# Patient Record
Sex: Male | Born: 1975 | Race: White | Hispanic: No | Marital: Married | Smoking: Former smoker
Health system: Southern US, Community
[De-identification: ages and names within clinical notes are randomized; demographics above are authoritative.]

## PROBLEM LIST (undated history)

## (undated) DIAGNOSIS — T7840XA Allergy, unspecified, initial encounter: Secondary | ICD-10-CM

## (undated) DIAGNOSIS — F410 Panic disorder [episodic paroxysmal anxiety] without agoraphobia: Secondary | ICD-10-CM

## (undated) DIAGNOSIS — B019 Varicella without complication: Secondary | ICD-10-CM

## (undated) DIAGNOSIS — G43909 Migraine, unspecified, not intractable, without status migrainosus: Secondary | ICD-10-CM

## (undated) DIAGNOSIS — F1120 Opioid dependence, uncomplicated: Secondary | ICD-10-CM

## (undated) HISTORY — DX: Varicella without complication: B01.9

## (undated) HISTORY — DX: Allergy, unspecified, initial encounter: T78.40XA

## (undated) HISTORY — DX: Migraine, unspecified, not intractable, without status migrainosus: G43.909

## (undated) HISTORY — PX: NASAL SEPTUM SURGERY: SHX37

---

## 2005-12-15 ENCOUNTER — Emergency Department: Payer: Self-pay | Admitting: Emergency Medicine

## 2007-10-24 ENCOUNTER — Emergency Department (HOSPITAL_COMMUNITY): Admission: EM | Admit: 2007-10-24 | Discharge: 2007-10-24 | Payer: Self-pay | Admitting: Emergency Medicine

## 2008-03-27 ENCOUNTER — Ambulatory Visit: Payer: Self-pay | Admitting: Anesthesiology

## 2008-05-10 ENCOUNTER — Emergency Department (HOSPITAL_COMMUNITY): Admission: EM | Admit: 2008-05-10 | Discharge: 2008-05-10 | Payer: Self-pay | Admitting: Emergency Medicine

## 2008-05-18 ENCOUNTER — Emergency Department: Payer: Self-pay | Admitting: Emergency Medicine

## 2008-06-15 ENCOUNTER — Emergency Department (HOSPITAL_COMMUNITY): Admission: EM | Admit: 2008-06-15 | Discharge: 2008-06-15 | Payer: Self-pay | Admitting: Emergency Medicine

## 2008-06-18 ENCOUNTER — Emergency Department: Payer: Self-pay | Admitting: Emergency Medicine

## 2008-12-11 ENCOUNTER — Emergency Department (HOSPITAL_COMMUNITY): Admission: EM | Admit: 2008-12-11 | Discharge: 2008-12-11 | Payer: Self-pay | Admitting: Emergency Medicine

## 2008-12-30 ENCOUNTER — Emergency Department (HOSPITAL_COMMUNITY): Admission: EM | Admit: 2008-12-30 | Discharge: 2008-12-30 | Payer: Self-pay | Admitting: Emergency Medicine

## 2009-02-23 ENCOUNTER — Emergency Department: Payer: Self-pay | Admitting: Internal Medicine

## 2009-03-02 ENCOUNTER — Emergency Department (HOSPITAL_COMMUNITY): Admission: EM | Admit: 2009-03-02 | Discharge: 2009-03-02 | Payer: Self-pay | Admitting: Emergency Medicine

## 2009-03-16 ENCOUNTER — Emergency Department (HOSPITAL_COMMUNITY): Admission: EM | Admit: 2009-03-16 | Discharge: 2009-03-16 | Payer: Self-pay | Admitting: Emergency Medicine

## 2009-03-17 ENCOUNTER — Emergency Department: Payer: Self-pay | Admitting: Emergency Medicine

## 2009-05-28 ENCOUNTER — Emergency Department: Payer: Self-pay | Admitting: Emergency Medicine

## 2009-06-07 ENCOUNTER — Emergency Department (HOSPITAL_COMMUNITY): Admission: EM | Admit: 2009-06-07 | Discharge: 2009-06-07 | Payer: Self-pay | Admitting: Emergency Medicine

## 2009-06-11 ENCOUNTER — Emergency Department (HOSPITAL_COMMUNITY): Admission: EM | Admit: 2009-06-11 | Discharge: 2009-06-11 | Payer: Self-pay | Admitting: Emergency Medicine

## 2009-06-12 ENCOUNTER — Emergency Department (HOSPITAL_COMMUNITY): Admission: EM | Admit: 2009-06-12 | Discharge: 2009-06-12 | Payer: Self-pay | Admitting: Adult Health

## 2009-06-21 ENCOUNTER — Emergency Department (HOSPITAL_COMMUNITY): Admission: EM | Admit: 2009-06-21 | Discharge: 2009-06-21 | Payer: Self-pay | Admitting: Emergency Medicine

## 2010-07-27 HISTORY — PX: VASECTOMY: SHX75

## 2012-03-11 ENCOUNTER — Ambulatory Visit: Payer: Self-pay

## 2012-03-14 ENCOUNTER — Emergency Department: Payer: Self-pay | Admitting: Emergency Medicine

## 2012-03-14 LAB — COMPREHENSIVE METABOLIC PANEL
Albumin: 3.9 g/dL (ref 3.4–5.0)
Anion Gap: 3 — ABNORMAL LOW (ref 7–16)
Calcium, Total: 8.7 mg/dL (ref 8.5–10.1)
Chloride: 100 mmol/L (ref 98–107)
Co2: 33 mmol/L — ABNORMAL HIGH (ref 21–32)
EGFR (African American): >60
EGFR (Non-African Amer.): >60
Potassium: 4 mmol/L (ref 3.5–5.1)
SGOT(AST): 29 U/L (ref 15–37)
SGPT (ALT): 32 U/L (ref 12–78)

## 2012-03-14 LAB — URINALYSIS, COMPLETE
Nitrite: NEGATIVE
Ph: 6 (ref 4.5–8.0)
Protein: 30
RBC,UR: 6 /HPF (ref 0–5)
Specific Gravity: 1.021 (ref 1.003–1.030)
WBC UR: 1 /HPF (ref 0–5)

## 2012-03-14 LAB — DRUG SCREEN, URINE
Barbiturates, Ur Screen: NEGATIVE (ref ?–200)
Benzodiazepine, Ur Scrn: NEGATIVE (ref ?–200)
Cannabinoid 50 Ng, Ur ~~LOC~~: NEGATIVE (ref ?–50)
Methadone, Ur Screen: POSITIVE (ref ?–300)
Phencyclidine (PCP) Ur S: POSITIVE (ref ?–25)
Tricyclic, Ur Screen: NEGATIVE (ref ?–1000)

## 2012-03-14 LAB — CBC
HCT: 37.2 % — ABNORMAL LOW (ref 40.0–52.0)
MCHC: 34.5 g/dL (ref 32.0–36.0)
MCV: 84 fL (ref 80–100)
Platelet: 328 10*3/uL (ref 150–440)
RBC: 4.43 10*6/uL (ref 4.40–5.90)
RDW: 12.5 % (ref 11.5–14.5)
WBC: 12.8 10*3/uL — ABNORMAL HIGH (ref 3.8–10.6)

## 2012-03-14 LAB — ETHANOL: Ethanol: <3 mg/dL

## 2013-12-30 ENCOUNTER — Encounter (HOSPITAL_COMMUNITY): Payer: Self-pay | Admitting: Emergency Medicine

## 2013-12-30 ENCOUNTER — Emergency Department (HOSPITAL_COMMUNITY)
Admission: EM | Admit: 2013-12-30 | Discharge: 2013-12-30 | Discharge status: Against Medical Advice | Payer: 59 | Attending: Neurology | Admitting: Neurology

## 2013-12-30 DIAGNOSIS — R109 Unspecified abdominal pain: Secondary | ICD-10-CM | POA: Diagnosis present

## 2013-12-30 LAB — COMPREHENSIVE METABOLIC PANEL
ALT: 47 U/L (ref 0–53)
AST: 29 U/L (ref 0–37)
Albumin: 4.1 g/dL (ref 3.5–5.2)
Alkaline Phosphatase: 93 U/L (ref 39–117)
BUN: 12 mg/dL (ref 6–23)
CALCIUM: 10 mg/dL (ref 8.4–10.5)
CO2: 26 mEq/L (ref 19–32)
CREATININE: 0.94 mg/dL (ref 0.50–1.35)
Chloride: 99 mEq/L (ref 96–112)
GFR calc Af Amer: >90 mL/min (ref >90)
GFR calc non Af Amer: >90 mL/min (ref >90)
Glucose, Bld: 120 mg/dL — ABNORMAL HIGH (ref 70–99)
Potassium: 4 mEq/L (ref 3.7–5.3)
Sodium: 139 mEq/L (ref 137–147)
TOTAL PROTEIN: 7.6 g/dL (ref 6.0–8.3)
Total Bilirubin: 0.2 mg/dL — ABNORMAL LOW (ref 0.3–1.2)

## 2013-12-30 LAB — CBC WITH DIFFERENTIAL/PLATELET
Basophils Absolute: 0 10*3/uL (ref 0.0–0.1)
Basophils Relative: 0 % (ref 0–1)
EOS ABS: 0.3 10*3/uL (ref 0.0–0.7)
Eosinophils Relative: 4 % (ref 0–5)
HCT: 39.8 % (ref 39.0–52.0)
Hemoglobin: 13.8 g/dL (ref 13.0–17.0)
LYMPHS ABS: 3.2 10*3/uL (ref 0.7–4.0)
Lymphocytes Relative: 33 % (ref 12–46)
MCH: 28.9 pg (ref 26.0–34.0)
MCHC: 34.7 g/dL (ref 30.0–36.0)
MCV: 83.3 fL (ref 78.0–100.0)
MONO ABS: 0.5 10*3/uL (ref 0.1–1.0)
MONOS PCT: 5 % (ref 3–12)
Neutro Abs: 5.6 10*3/uL (ref 1.7–7.7)
Neutrophils Relative %: 58 % (ref 43–77)
Platelets: 310 10*3/uL (ref 150–400)
RBC: 4.78 MIL/uL (ref 4.22–5.81)
RDW: 12.4 % (ref 11.5–15.5)
WBC: 9.7 10*3/uL (ref 4.0–10.5)

## 2013-12-30 LAB — LIPASE, BLOOD: LIPASE: 21 U/L (ref 11–59)

## 2013-12-30 NOTE — ED Notes (Signed)
Called pt for re-assessment with no answer 

## 2013-12-30 NOTE — ED Notes (Signed)
Per pt sts burning pain in stomach since yesterday. sts he has been drinking carbonated beverages with some relief. sts only about 30 min and then it returns. sts possible he has ben spitting up blood.

## 2014-09-05 ENCOUNTER — Emergency Department (HOSPITAL_COMMUNITY)
Admission: EM | Admit: 2014-09-05 | Discharge: 2014-09-05 | Disposition: A | Discharge status: Home / Self Care | Payer: Self-pay | Attending: Emergency Medicine | Admitting: Emergency Medicine

## 2014-09-05 ENCOUNTER — Encounter (HOSPITAL_COMMUNITY): Payer: Self-pay | Admitting: Neurology

## 2014-09-05 DIAGNOSIS — T23192A Burn of first degree of multiple sites of left wrist and hand, initial encounter: Secondary | ICD-10-CM | POA: Insufficient documentation

## 2014-09-05 DIAGNOSIS — Y9389 Activity, other specified: Secondary | ICD-10-CM | POA: Insufficient documentation

## 2014-09-05 DIAGNOSIS — Y272XXA Contact with hot fluids, undetermined intent, initial encounter: Secondary | ICD-10-CM | POA: Insufficient documentation

## 2014-09-05 DIAGNOSIS — Y998 Other external cause status: Secondary | ICD-10-CM | POA: Insufficient documentation

## 2014-09-05 DIAGNOSIS — T23102A Burn of first degree of left hand, unspecified site, initial encounter: Secondary | ICD-10-CM

## 2014-09-05 DIAGNOSIS — Y92511 Restaurant or cafe as the place of occurrence of the external cause: Secondary | ICD-10-CM | POA: Insufficient documentation

## 2014-09-05 MED ORDER — SILVER SULFADIAZINE 1 % EX CREA
TOPICAL_CREAM | Freq: Once | CUTANEOUS | Status: AC
  Administered 2014-09-05: 11:00:00 via TOPICAL
  Filled 2014-09-05: qty 85

## 2014-09-05 MED ORDER — SILVER SULFADIAZINE 1 % EX CREA: 1.0000 "application " | TOPICAL_CREAM | Freq: Every day | CUTANEOUS | Status: DC

## 2014-09-05 MED ORDER — IBUPROFEN 400 MG PO TABS
800.0000 mg | ORAL_TABLET | Freq: Once | ORAL | Status: AC
  Administered 2014-09-05: 800 mg via ORAL
  Filled 2014-09-05: qty 4
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
## 2014-09-05 NOTE — Discharge Instructions (Signed)
Do not hesitate to return to the emergency room for any new, worsening or concerning symptoms.  Please obtain primary care using resource guide below. But the minute you were seen in the emergency room and that they will need to obtain records for further outpatient management.  If you see signs of infection (warmth, redness, tenderness, pus, sharp increase in pain, fever, red streaking) immediately return to the emergency department.  Burn Care Your skin is a natural barrier to infection. It is the largest organ of your body. Burns damage this natural protection. To help prevent infection, it is very important to follow your caregiver's instructions in the care of your burn. Burns are classified as:  First degree. There is only redness of the skin (erythema). No scarring is expected.  Second degree. There is blistering of the skin. Scarring may occur with deeper burns.  Third degree. All layers of the skin are injured, and scarring is expected. HOME CARE INSTRUCTIONS   Wash your hands well before changing your bandage.  Change your bandage as often as directed by your caregiver.  Remove the old bandage. If the bandage sticks, you may soak it off with cool, clean water.  Cleanse the burn thoroughly but gently with mild soap and water.  Pat the area dry with a clean, dry cloth.  Apply a thin layer of antibacterial cream to the burn.  Apply a clean bandage as instructed by your caregiver.  Keep the bandage as clean and dry as possible.  Elevate the affected area for the first 24 hours, then as instructed by your caregiver.  Only take over-the-counter or prescription medicines for pain, discomfort, or fever as directed by your caregiver. SEEK IMMEDIATE MEDICAL CARE IF:   You develop excessive pain.  You develop redness, tenderness, swelling, or red streaks near the burn.  The burned area develops yellowish-white fluid (pus) or a bad smell.  You have a fever. MAKE SURE YOU:     Understand these instructions.  Will watch your condition.  Will get help right away if you are not doing well or get worse. Document Released: 07/13/2005 Document Revised: 10/05/2011 Document Reviewed: 12/03/2010 [facility] Patient Information 2015 [place], [place]. This information is not intended to replace advice given to you by your health care provider. Make sure you discuss any questions you have with your health care provider.    Emergency Department Resource Guide 1) Find a Doctor and Pay Out of Pocket Although you won't have to find out who is covered by your insurance plan, it is a good idea to ask around and get recommendations. You will then need to call the office and see if the doctor you have chosen will accept you as a new patient and what types of options they offer for patients who are self-pay. Some doctors offer discounts or will set up payment plans for their patients who do not have insurance, but you will need to ask so you aren't surprised when you get to your appointment.  2) Contact Your Local Health Department Not all health departments have doctors that can see patients for sick visits, but many do, so it is worth a call to see if yours does. If you don't know where your local health department is, you can check in your phone book. The CDC also has a tool to help you locate your state's health department, and many state websites also have listings of all of their local health departments.  3) Find a Walk-in Clinic If  your illness is not likely to be very severe or complicated, you may want to try a walk in clinic. These are popping up all over the country in pharmacies, drugstores, and shopping centers. They're usually staffed by nurse practitioners or physician assistants that have been trained to treat common illnesses and complaints. They're usually fairly quick and inexpensive. However, if you have serious medical issues or chronic medical problems, these are  probably not your best option.  No Primary Care Doctor: - Call Health Connect at  [phone] - they can help you locate a primary care doctor that  accepts your insurance, provides certain services, etc. - Physician Referral Service- [phone]  Chronic Pain Problems: Organization         Address  Phone   Notes  [facility]  [phone] Patients need to be referred by their primary care doctor.   Medication Assistance: Organization         Address  Phone   Notes  [facility] [address] [place], [place] [address] [phone] --Must be a resident of [facility] -- Must have NO insurance coverage whatsoever (no Medicaid/ Medicare, etc.) -- The pt. MUST have a primary care doctor that directs their care regularly and follows them in the community   [facility]ist  [phone]   [employer]  [phone]    Agencies that provide inexpensive medical care: Organization         Address  Phone   Notes  [place]  [phone]   [facility]    [phone]   [facility] [place], [place] [address] [phone]   [place] [address] [place] [phone]   Planned Parenthood    [phone]   [facility]    [phone]   [place] and [place] [address], [place] Phone:  [phone], Fax:  [phone] Hours of Operation:  9 am - 6 pm, M-F.  Also accepts Medicaid/Medicare and self-pay.  [facility] for [place], [address], [place] Phone: [phone], Fax: [phone]. Hours of Operation:  8:30 am - 5:30 pm, M-F.  Also accepts Medicaid and self-pay.  [facility] [place] [address], [place] Phone: [phone]   [place], [place], [place] [phone], Ext. [phone] Mondays & Thursdays:  7-9 AM.  First 15 patients are seen on a first come, first serve basis.    [place] Providers:  Organization         Address  Phone   Notes  [facility] [address], [place] [phone] Also accepts self-pay patients.  [facility] [address], [place]  [phone]   [place], [address], [place] [phone]   [facility] [address] [place] [phone]   Lucianne Lei [address], [place]   [phone] Only accepts [place] Access [place] patients after they have their name applied to their card.   Self-Pay (no insurance) in [facility]:  Organization         Address  Phone   Notes  Sickle Cell Patients, [facility] Internal [facility] [place], [place] [phone]  [facility] [place] [phone]   [facility] [place]  [place], [address], [place] [phone]   [facility]/Dr. Osei-Bonsu  [address], [place] or [address], [place] [phone] Phone number for both [place] and [place] locations is the same.  [facility] [address], [place] [phone]   [facility] [address], [place] or [address] [phone]   [facility] [address] [place] [phone], phone; [phone], fax Sees patients 1st and 3rd Saturday of every month.  Must not qualify for public or private insurance (i.e. Medicaid, Medicare, [place] Health Choice, Veterans' Benefits)  Household income should be no more than 200% of the poverty level The clinic cannot treat you if you are pregnant or think you are pregnant  Sexually transmitted diseases are not treated at the clinic.     Dental Care: Organization         Address  Phone  Notes  [facility] Department of [facility] [place] [phone] Accepts children up to age 73 who are enrolled in [place] or [place]; pregnant women with a Medicaid card; and children who have applied for Medicaid or [place] Health Choice, but were declined, whose parents can pay a reduced fee at time of service.  [facility] Department of [facility]  [address], [place] [phone] Accepts children up to age 48 who are enrolled in [place] or [place]; pregnant women with a Medicaid card; and children who have applied for Medicaid or [place] Health Choice, but were declined, whose parents can pay a reduced fee at time of service.  [place] Adult [facility] Access PROGRAM  [place] [phone] Patients are seen by appointment only. Walk-ins are not accepted. [place] will see patients 13 years of age and older. Monday - Tuesday (8am-5pm) Most Wednesdays (8:30-5pm) $30 per visit, cash only  [facility] Adult Dental Access PROGRAM  [address], [facility] [phone] Patients are seen by appointment only. Walk-ins are not accepted. [place] will see patients 71 years of age and older. One Wednesday Evening (Monthly: Volunteer Based).  $30 per visit, cash only  [place]  [phone] for adults; Children under age 29, call Graduate Pediatric Dentistry at [phone]. Children aged 48-14, please call [phone] to request a pediatric application.  Dental services are provided in all areas of dental care including fillings, crowns and bridges, complete and partial dentures, implants, gum treatment, root canals, and extractions. Preventive care is also provided. Treatment is provided to both adults and children. Patients are selected via a lottery and there is often a waiting  list.   [facility] [address], [place]  [phone] [email or web address]   [facility] [address] [place], [place] [phone], Ext. [phone] Second and Fourth Thursday of each month, opens at 6:30 AM; Clinic ends at 9 AM.  Patients are seen on a first-come first-served basis, and a limited number are seen during each clinic.   [facility]  [address] Hillard Danker [place], [place] [phone]   Eligibility Requirements You must have lived in [place], [place], or [place] counties for at least the  last three months.   You cannot be eligible for state or federal sponsored [employer], including [employer], [place], or [employer].   You generally cannot be eligible for healthcare insurance through your employer.    How to apply: Eligibility screenings are held every Tuesday and Wednesday afternoon from 1:00 pm until 4:00 pm. You do not need an appointment for the interview!  [facility] [address], [place], [place]   [place]  [place] Department  [place]  [phone]    Behavioral Health Resources in the Community: Intensive Outpatient Programs Organization         Address  Phone  Notes  [place]. [address], [place], [place] [phone]   [facility] [address], [place], [place]   ADS: Alcohol & Drug Svcs [address], [place], [place]   [place] [address],  [place], [place] or [phone]   Substance Abuse Resources Organization         Address  Phone  Notes  Alcohol and Drug Services  [phone]   [place]  [phone]   [facility] [place]   Chinita Pester  [phone]   Residential & Outpatient Substance Abuse Program   [phone]   Psychological Services Organization         Address  Phone  Notes  [facility] [place]  [place]  [phone]   Luis M. [place] [address], [place] or [phone]    Mobile Crisis Teams Organization         Address  Phone  Notes  [facility], Mobile Crisis Care Unit  [phone]   [facility]otherapeutic [facility]  [address] [place], [place]   [place] [address] [place] [phone]    Self-Help/Support Groups Organization         Address  Phone             Notes  [place]. of [place] - variety of support groups  [place] Call for more information  Narcotics Anonymous (NA), [facility] [address], [employer] [place]  2 meetings at this location   Special educational needs teacher         Address  Phone  Notes  ASAP Residential Treatment [place],    [place]  [phone]   [facility]  [address] [place] [address]77414, [place], [place]   [place], [place] [phone] Admissions: 8am-3pm M-F  Incentives Substance [place] [address],    [place], [place] [phone]   [facility] [address] [place], [place], [place]   [facility] [address],  [place], [place]   [facility] Programs - Intensive Outpatient [address], [place], [place]   [facility] ([place].,  [place], [place] [phone] or [phone]   [facility] Treatment Services (RTS) [address], [place], [place] Accepts Medicaid  [facility] [place] [address],  [place] [phone] Substance Abuse/Addiction Treatment   [facility] Organization          Address  Phone  Notes  [facility]  [phone]  Domenic Schwab, PhD [address] Arlis Porta [place], [place]   [phone] or [phone]   [place], [place] [phone]   [address], [place], [place] [phone] Insurance/Medicaid/sponsorship through [facility] and Families [address], [address] [place]                                    [place], [place] [phone] [place] [address], [place] [phone]    Dr. Adele Schilder  [phone]   [facility] Dept. 1) [address] [place] 2) [place] 3)  [place] [address], [place] [phone]  [phone]   [place] [phone] or [phone] (After Hours)

## 2015-10-15 ENCOUNTER — Encounter: Payer: Self-pay | Admitting: Internal Medicine

## 2015-10-15 ENCOUNTER — Ambulatory Visit (INDEPENDENT_AMBULATORY_CARE_PROVIDER_SITE_OTHER): Payer: BLUE CROSS/BLUE SHIELD | Admitting: Internal Medicine

## 2015-10-15 VITALS — BP 118/78 | HR 83 | Temp 97.8°F | Ht 66.75 in | Wt 219.0 lb

## 2015-10-15 DIAGNOSIS — G4719 Other hypersomnia: Secondary | ICD-10-CM

## 2015-10-15 DIAGNOSIS — R0683 Snoring: Secondary | ICD-10-CM

## 2015-10-15 DIAGNOSIS — G4700 Insomnia, unspecified: Secondary | ICD-10-CM

## 2015-10-15 DIAGNOSIS — F411 Generalized anxiety disorder: Secondary | ICD-10-CM

## 2015-10-15 DIAGNOSIS — M549 Dorsalgia, unspecified: Secondary | ICD-10-CM

## 2015-10-15 DIAGNOSIS — G43809 Other migraine, not intractable, without status migrainosus: Secondary | ICD-10-CM | POA: Insufficient documentation

## 2015-10-15 DIAGNOSIS — Z91048 Other nonmedicinal substance allergy status: Secondary | ICD-10-CM

## 2015-10-15 DIAGNOSIS — G8929 Other chronic pain: Secondary | ICD-10-CM

## 2015-10-15 DIAGNOSIS — Z9109 Other allergy status, other than to drugs and biological substances: Secondary | ICD-10-CM | POA: Insufficient documentation

## 2015-10-15 DIAGNOSIS — Z8042 Family history of malignant neoplasm of prostate: Secondary | ICD-10-CM

## 2015-10-15 MED ORDER — CITALOPRAM HYDROBROMIDE 10 MG PO TABS: 10.0000 mg | ORAL_TABLET | Freq: Every day | ORAL | Status: DC

## 2015-10-15 MED ORDER — ALPRAZOLAM 0.25 MG PO TABS: 0.2500 mg | ORAL_TABLET | Freq: Two times a day (BID) | ORAL | Status: DC | PRN

## 2015-10-15 NOTE — Progress Notes (Signed)
Pre visit review using our clinic review tool, if applicable. No additional management support is needed unless otherwise documented below in the visit note. 
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
## 2015-10-15 NOTE — Patient Instructions (Signed)
Generalized Anxiety Disorder Generalized anxiety disorder (GAD) is a mental disorder. It interferes with life functions, including relationships, work, and school. GAD is different from normal anxiety, which everyone experiences at some point in their lives in response to specific life events and activities. Normal anxiety actually helps us prepare for and get through these life events and activities. Normal anxiety goes away after the event or activity is over.  GAD causes anxiety that is not necessarily related to specific events or activities. It also causes excess anxiety in proportion to specific events or activities. The anxiety associated with GAD is also difficult to control. GAD can vary from mild to severe. People with severe GAD can have intense waves of anxiety with physical symptoms (panic attacks).  SYMPTOMS The anxiety and worry associated with GAD are difficult to control. This anxiety and worry are related to many life events and activities and also occur more days than not for 6 months or longer. People with GAD also have three or more of the following symptoms (one or more in children):  Restlessness.   Fatigue.  Difficulty concentrating.   Irritability.  Muscle tension.  Difficulty sleeping or unsatisfying sleep. DIAGNOSIS GAD is diagnosed through an assessment by your health care provider. Your health care provider will ask you questions aboutyour mood,physical symptoms, and events in your life. Your health care provider may ask you about your medical history and use of alcohol or drugs, including prescription medicines. Your health care provider may also do a physical exam and blood tests. Certain medical conditions and the use of certain substances can cause symptoms similar to those associated with GAD. Your health care provider may refer you to a mental health specialist for further evaluation. TREATMENT The following therapies are usually used to treat GAD:    Medication. Antidepressant medication usually is prescribed for long-term daily control. Antianxiety medicines may be added in severe cases, especially when panic attacks occur.   Talk therapy (psychotherapy). Certain types of talk therapy can be helpful in treating GAD by providing support, education, and guidance. A form of talk therapy called cognitive behavioral therapy can teach you healthy ways to think about and react to daily life events and activities.  Stress managementtechniques. These include yoga, meditation, and exercise and can be very helpful when they are practiced regularly. A mental health specialist can help determine which treatment is best for you. Some people see improvement with one therapy. However, other people require a combination of therapies.   This information is not intended to replace advice given to you by your health care provider. Make sure you discuss any questions you have with your health care provider.   Document Released: 11/07/2012 Document Revised: 08/03/2014 Document Reviewed: 11/07/2012 Elsevier Interactive Patient Education 2016 [employer]  

## 2015-10-15 NOTE — Assessment & Plan Note (Signed)
Continue Flonase daily 

## 2015-10-15 NOTE — Assessment & Plan Note (Signed)
Will start Celexa 10 mg daily RX for Xanax 0.25 mg for times of sever anxiety/panic Support offered today

## 2015-10-15 NOTE — Assessment & Plan Note (Signed)
He wants to continue Excedrin prn for right now He declines RX for Imitrex

## 2015-10-15 NOTE — Assessment & Plan Note (Signed)
He will continue Naproxen as needed

## 2015-10-15 NOTE — Assessment & Plan Note (Signed)
Will start PSA screening at 42

## 2015-10-24 ENCOUNTER — Encounter: Payer: Self-pay | Admitting: Internal Medicine

## 2015-10-24 ENCOUNTER — Ambulatory Visit (INDEPENDENT_AMBULATORY_CARE_PROVIDER_SITE_OTHER): Payer: BLUE CROSS/BLUE SHIELD | Admitting: Internal Medicine

## 2015-10-24 VITALS — BP 122/84 | HR 83 | Temp 97.9°F | Wt 220.5 lb

## 2015-10-24 DIAGNOSIS — N50819 Testicular pain, unspecified: Secondary | ICD-10-CM | POA: Diagnosis not present

## 2015-10-24 DIAGNOSIS — M62830 Muscle spasm of back: Secondary | ICD-10-CM | POA: Diagnosis not present

## 2015-10-24 DIAGNOSIS — F411 Generalized anxiety disorder: Secondary | ICD-10-CM

## 2015-10-24 MED ORDER — METHOCARBAMOL 750 MG PO TABS
750.0000 mg | ORAL_TABLET | Freq: Three times a day (TID) | ORAL | Status: DC | PRN
Start: 2015-10-24 — End: 2015-11-18

## 2015-10-24 NOTE — Progress Notes (Signed)
Pre visit review using our clinic review tool, if applicable. No additional management support is needed unless otherwise documented below in the visit note. 

## 2015-10-24 NOTE — Patient Instructions (Signed)

## 2015-10-24 NOTE — Progress Notes (Signed)
Subjective:    Patient ID: Erik Aguilar, male    DOB: 01/24/1976, 40 y.o.   MRN: [id number]  HPI  Pt presents to the clinic today with c/o multiple complaints.  Testicular pain; This started 5 years ago. It has been intermittent. He describes the pain as if "someone is tugging on them". He has never noticed any swelling. He denies dysuria, penile discharge or difficulty ejaculating. He did have a vasectomy in 2012 and is concerned it may have something to do with that.  Anxiety: He was started on Celexa 1 week ago. He has not noticed any difference in the way that he feels. He is still anxious and gets panicky at times. He reports he took the Xanax, but it did not have a noticeable effect. He wants to know what the next course of action will be.  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 The pain radiates down his legs. He denies numbness or tingling. He does feel like his muscles spasm in his back, which brings this on. He does try to stretch and he takes Naproxen daily. He took a muscle relaxer in the past and reports he feels like that may be beneficial now.  Review of Systems      Past Medical History  Diagnosis Date  . Migraines   . Chicken pox   . Allergy     Current Outpatient Prescriptions  Medication Sig Dispense Refill  . ALPRAZolam (XANAX) 0.25 MG tablet Take 1 tablet (0.25 mg total) by mouth 2 (two) times daily as needed for anxiety. 20 tablet 0  . citalopram (CELEXA) 10 MG tablet Take 1 tablet (10 mg total) by mouth daily. 30 tablet 2  . naproxen (NAPROSYN) 500 MG tablet Take 500 mg by mouth as needed.     No current facility-administered medications for this visit.    Allergies  Allergen Reactions  . Tramadol Other (See Comments)    Altered mental status    Family History  Problem Relation Age of Onset  . Mental illness Mother   . Prostate cancer Father   . Prostate cancer  Paternal Grandfather     Social History   Social History  . Marital Status: Married    Spouse Name: N/A  . Number of Children: N/A  . Years of Education: N/A   Occupational History  . Not on file.   Social History Main Topics  . Smoking status: Current Some Day Smoker  . Smokeless tobacco: Never Used     Comment: pt uses e-cig  . Alcohol Use: 0.0 oz/week    0 Standard drinks or equivalent per week     Comment: rare  . Drug Use: No  . Sexual Activity: Yes   Other Topics Concern  . Not on file   Social History Narrative     Constitutional: Denies fever, malaise, fatigue, headache or abrupt weight changes.  Respiratory: Denies difficulty breathing, shortness of breath, cough or sputum production.   Cardiovascular: Denies chest pain, chest tightness, palpitations or swelling in the hands or feet.  Gastrointestinal: Denies abdominal pain, bloating, constipation, diarrhea or blood in the stool.  GU: Pt reports testicular pain. Denies urgency, frequency, pain with urination, burning sensation, blood in urine, odor or discharge. Musculoskeletal: Pt reports back pain. Denies decrease in range of motion, difficulty with gait, or joint pain and swelling.  Skin: Denies redness, rashes, lesions  or ulcercations.  Neurological: Denies dizziness, difficulty with memory, difficulty with speech or problems with balance and coordination.  Psych: Pt reports anxiety. Denies depression, SI/HI.  No other specific complaints in a complete review of systems (except as listed in HPI above).  Objective:   Physical Exam   BP 122/84 mmHg  Pulse 83  Temp(Src) 97.9 F (36.6 C) (Oral)  Wt 220 lb 8 oz (100.018 kg)  SpO2 97% Wt Readings from Last 3 Encounters:  10/24/15 220 lb 8 oz (100.018 kg)  10/15/15 219 lb (99.338 kg)    General: Appears his stated age, well developed, well nourished in NAD. Abdomen: Soft and nontender. Normal bowel sounds. No distention or masses noted. Scrotum:  Normal male anatomy. No pain with palpation of the testicles.  Musculoskeletal: Normal flexion, extension and rotation of the spine. No bony tenderness noted. Pain with palpation of the para lumbar muscles bilaterally.  No difficulty with gait.  Neurological: Alert and oriented. Cranial nerves II-XII grossly  intact. Coordination normal.  Psychiatric: He is anxious appearing today.  BMET    Component Value Date/Time   NA 139 12/30/2013 1456   NA 136 03/14/2012 1725   K 4.0 12/30/2013 1456   K 4.0 03/14/2012 1725   CL 99 12/30/2013 1456   CL 100 03/14/2012 1725   CO2 26 12/30/2013 1456   CO2 33* 03/14/2012 1725   GLUCOSE 120* 12/30/2013 1456   GLUCOSE 101* 03/14/2012 1725   BUN 12 12/30/2013 1456   BUN 15 03/14/2012 1725   CREATININE 0.94 12/30/2013 1456   CREATININE 0.88 03/14/2012 1725   CALCIUM 10.0 12/30/2013 1456   CALCIUM 8.7 03/14/2012 1725   GFRNONAA >90 12/30/2013 1456   GFRNONAA >60 03/14/2012 1725   GFRAA >90 12/30/2013 1456   GFRAA >60 03/14/2012 1725    Lipid Panel  No results found for: CHOL, TRIG, HDL, CHOLHDL, VLDL, LDLCALC  CBC    Component Value Date/Time   WBC 9.7 12/30/2013 1456   WBC 12.8* 03/14/2012 1725   RBC 4.78 12/30/2013 1456   RBC 4.43 03/14/2012 1725   HGB 13.8 12/30/2013 1456   HGB 12.8* 03/14/2012 1725   HCT 39.8 12/30/2013 1456   HCT 37.2* 03/14/2012 1725   PLT 310 12/30/2013 1456   PLT 328 03/14/2012 1725   MCV 83.3 12/30/2013 1456   MCV 84 03/14/2012 1725   MCH 28.9 12/30/2013 1456   MCH 29.0 03/14/2012 1725   MCHC 34.7 12/30/2013 1456   MCHC 34.5 03/14/2012 1725   RDW 12.4 12/30/2013 1456   RDW 12.5 03/14/2012 1725   LYMPHSABS 3.2 12/30/2013 1456   MONOABS 0.5 12/30/2013 1456   EOSABS 0.3 12/30/2013 1456   BASOSABS 0.0 12/30/2013 1456    Hgb A1C No results found for: HGBA1C      Assessment & Plan:   Testicular pain:  Exam benign US scrotum ordered  Anxiety:  Advised him he has to give the Celexa more time He  can take 2 0.25 mg tabs of Xanax during panic attack to see if more effective  Muscle spasms of back:  Stretching exercises given Continue Naproxen eRx for Robaxin to take as needed, not daily  RTC as needed or if symptoms persist or worsen

## 2015-10-25 ENCOUNTER — Telehealth: Payer: Self-pay | Admitting: *Deleted

## 2015-10-25 NOTE — Telephone Encounter (Signed)
Pt contacted office stating that he was seen on 3/30 and advised to take 2 tabs of xanax to determine its effectiveness. He states that two tabs are not effective and is requesting a call back with a resolution as he is needing a new Rx

## 2015-10-25 NOTE — Telephone Encounter (Signed)
He will need to continue the Celexa. If Xanax 0.5 mg not effective, we can try Ativan 0.5 mg daily prn. How often is he taking the Xanax?

## 2015-10-28 MED ORDER — LORAZEPAM 0.5 MG PO TABS: 0.5000 mg | ORAL_TABLET | Freq: Every day | ORAL | Status: DC

## 2015-10-28 NOTE — Telephone Encounter (Signed)
I don't want him to take 1 mg Xanax. We can try Ativan 0.g mg daily prn. If okay, call in # 30, 0 refills. Try not to take daily, only when feeling panicky.

## 2015-10-28 NOTE — Telephone Encounter (Signed)
Patient called back and would like a response today.  Please call patient back at [phone].

## 2015-10-28 NOTE — Telephone Encounter (Signed)
Verify dose of Ativan

## 2015-10-28 NOTE — Addendum Note (Signed)
Addended by: Roena MaladyEVONTENNO, Ciarra Braddy Y on: 10/28/2015 05:14 PM   Modules accepted: Orders, Medications

## 2015-10-28 NOTE — Telephone Encounter (Signed)
Rx called in to pharmacy. Pt is aware.  

## 2015-10-28 NOTE — Telephone Encounter (Signed)
0.5mg  

## 2015-10-28 NOTE — Telephone Encounter (Signed)
Patient reports that he took 1-2 a day of Xanax as directed for 10 days.... He states you advised pt to try and take 2 tablets at once to see if that helps. Pt states he had minimal relief with taking the 2 tablets and he only did that 1 time on Friday. Pt states he thinks that maybe taking 1 mg will be more beneficial to Sx control---please advise

## 2015-10-29 ENCOUNTER — Ambulatory Visit: Payer: BLUE CROSS/BLUE SHIELD

## 2015-11-14 ENCOUNTER — Encounter (HOSPITAL_COMMUNITY): Payer: Self-pay | Admitting: Emergency Medicine

## 2015-11-14 ENCOUNTER — Encounter: Payer: Self-pay | Admitting: Emergency Medicine

## 2015-11-14 ENCOUNTER — Emergency Department
Admission: EM | Admit: 2015-11-14 | Discharge: 2015-11-14 | Disposition: A | Discharge status: Home / Self Care | Payer: BLUE CROSS/BLUE SHIELD | Attending: Emergency Medicine | Admitting: Emergency Medicine

## 2015-11-14 ENCOUNTER — Ambulatory Visit (HOSPITAL_COMMUNITY)
Admission: EM | Admit: 2015-11-14 | Discharge: 2015-11-14 | Disposition: A | Discharge status: Home / Self Care | Payer: BLUE CROSS/BLUE SHIELD | Attending: Family Medicine | Admitting: Family Medicine

## 2015-11-14 ENCOUNTER — Emergency Department (HOSPITAL_COMMUNITY)
Admission: EM | Admit: 2015-11-14 | Discharge: 2015-11-14 | Disposition: A | Discharge status: Home / Self Care | Payer: BLUE CROSS/BLUE SHIELD | Attending: Emergency Medicine | Admitting: Emergency Medicine

## 2015-11-14 DIAGNOSIS — Z87891 Personal history of nicotine dependence: Secondary | ICD-10-CM | POA: Insufficient documentation

## 2015-11-14 DIAGNOSIS — F419 Anxiety disorder, unspecified: Secondary | ICD-10-CM | POA: Diagnosis present

## 2015-11-14 DIAGNOSIS — R6884 Jaw pain: Secondary | ICD-10-CM | POA: Insufficient documentation

## 2015-11-14 DIAGNOSIS — F410 Panic disorder [episodic paroxysmal anxiety] without agoraphobia: Secondary | ICD-10-CM

## 2015-11-14 DIAGNOSIS — Z5321 Procedure and treatment not carried out due to patient leaving prior to being seen by health care provider: Secondary | ICD-10-CM | POA: Diagnosis not present

## 2015-11-14 HISTORY — DX: Panic disorder (episodic paroxysmal anxiety): F41.0

## 2015-11-14 MED ORDER — CLONAZEPAM 0.5 MG PO TABS: 0.5000 mg | ORAL_TABLET | Freq: Three times a day (TID) | ORAL | Status: DC | PRN

## 2015-11-14 NOTE — ED Notes (Signed)
Pt. reports anxiety attack onset yesterday with right jaw pain , denies jaw injury .

## 2015-11-14 NOTE — ED Notes (Signed)
Pt [facility] ER [facility] with complaints of jaw pain. Pt reports his jaw started to lock since yesterday night around 23:00. [facility]  but left because of wait. Pt able to speak in complete sentences no respiratory distress noted.

## 2015-11-14 NOTE — Discharge Instructions (Signed)
Contact your doctor for follow-up in am, [facility] problems tonight

## 2015-11-14 NOTE — ED Notes (Signed)
Patient reports issues with anxiety/panic attacks.  Patient reports clenching of left jaw.  Reports clenching and drawing of jaw.  Patient is ppening and closing mouth without difficulty.  Patient reports he is feeling better now.

## 2015-11-14 NOTE — ED Notes (Signed)
Pt.never answer when called for a room.

## 2015-11-14 NOTE — ED Provider Notes (Signed)
CSN: [id number]     Arrival date & time 11/14/15  1824 History   First MD Initiated Contact with Patient 11/14/15 1919     Chief Complaint  Patient presents with  . Anxiety  . Jaw Pain   (Consider location/radiation/quality/duration/timing/severity/associated sxs/prior Treatment) Patient is a 40 y.o. male presenting with anxiety. The history is provided by the patient.  Anxiety This is a chronic problem. The current episode started 6 to 12 hours ago. The problem has been gradually improving. Associated symptoms comments: Jaw muscle spasms from anxiety..    Past Medical History  Diagnosis Date  . Migraines   . Chicken pox   . Allergy   . Anxiety attack    Past Surgical History  Procedure Laterality Date  . Nasal septum surgery    . Vasectomy  2012   Family History  Problem Relation Age of Onset  . Mental illness Mother   . Prostate cancer Father   . Prostate cancer Paternal Grandfather    Social History  Substance Use Topics  . Smoking status: Former Smoker    Types: Cigarettes  . Smokeless tobacco: Never Used     Comment: pt uses e-cig  . Alcohol Use: 0.0 oz/week    0 Standard drinks or equivalent per week     Comment: rare    Review of Systems  Psychiatric/Behavioral: Positive for sleep disturbance and agitation. Negative for suicidal ideas and self-injury. The patient is nervous/anxious.   All other systems reviewed and are negative.   Allergies  Tramadol  Home Medications   Prior to Admission medications   Medication Sig Start Date End Date Taking? Authorizing Provider  citalopram (CELEXA) 10 MG tablet Take 1 tablet (10 mg total) by mouth daily. 10/15/15   Lorre Munroe, NP  clonazePAM (KLONOPIN) 0.5 MG tablet Take 1 tablet (0.5 mg total) by mouth 3 (three) times daily as needed for anxiety. 11/14/15   Linna Hoff, MD  LORazepam (ATIVAN) 0.5 MG tablet Take 1 tablet (0.5 mg total) by mouth daily. 10/28/15   Lorre Munroe, NP  methocarbamol (ROBAXIN) 750 MG  tablet Take 1 tablet (750 mg total) by mouth every 8 (eight) hours as needed for muscle spasms. 10/24/15   Lorre Munroe, NP  naproxen (NAPROSYN) 500 MG tablet Take 500 mg by mouth as needed.    Historical Provider, MD   Meds Ordered and Administered this Visit  Medications - No data to display  BP 140/94 mmHg  Pulse 97  Temp(Src) 98.2 F (36.8 C) (Oral)  Resp 18  SpO2 96% No data found.   Physical Exam  Constitutional: He appears well-developed and well-nourished. He appears distressed.  Skin: Skin is warm and dry.  Psychiatric:  Reports marital issues and job problems, no threats voiced.  Nursing note and vitals reviewed.   ED Course  Procedures (including critical care time)  Labs Review Labs Reviewed - No data to display  Imaging Review No results found.   Visual Acuity Review  Right Eye Distance:   Left Eye Distance:   Bilateral Distance:    Right Eye Near:   Left Eye Near:    Bilateral Near:         MDM   1. Anxiety attack    Meds ordered this encounter  Medications  . clonazePAM (KLONOPIN) 0.5 MG tablet    Sig: Take 1 tablet (0.5 mg total) by mouth 3 (three) times daily as needed for anxiety.    Dispense:  21 tablet  Refill:  0       Linna HoffJames D Akasha Melena, MD 11/14/15 [phone]

## 2015-11-14 NOTE — ED Notes (Signed)
Called pt several times, this RN walked around waiting area calling pt's name pt is not in waiting room.

## 2015-11-18 ENCOUNTER — Encounter: Payer: Self-pay | Admitting: Internal Medicine

## 2015-11-18 ENCOUNTER — Ambulatory Visit (INDEPENDENT_AMBULATORY_CARE_PROVIDER_SITE_OTHER): Payer: BLUE CROSS/BLUE SHIELD | Admitting: Internal Medicine

## 2015-11-18 VITALS — BP 122/82 | HR 84 | Temp 98.1°F | Wt 224.0 lb

## 2015-11-18 DIAGNOSIS — R6884 Jaw pain: Secondary | ICD-10-CM

## 2015-11-18 DIAGNOSIS — F411 Generalized anxiety disorder: Secondary | ICD-10-CM | POA: Diagnosis not present

## 2015-11-18 MED ORDER — CITALOPRAM HYDROBROMIDE 20 MG PO TABS: 20.0000 mg | ORAL_TABLET | Freq: Every day | ORAL | Status: DC

## 2015-11-18 MED ORDER — CLONAZEPAM 0.5 MG PO TABS: 0.5000 mg | ORAL_TABLET | Freq: Every day | ORAL | Status: DC | PRN

## 2015-11-18 MED ORDER — CYCLOBENZAPRINE HCL 10 MG PO TABS: 10.0000 mg | ORAL_TABLET | Freq: Three times a day (TID) | ORAL | Status: DC | PRN

## 2015-11-18 NOTE — Progress Notes (Signed)
Subjective:    Patient ID: Erik Aguilar, male    DOB: 1976-03-15, 40 y.o.   MRN: [id number]  HPI  Pt presents to the clinic today for ER f/u jaw pain and anxiety attack. This occurred 5 days ago. He reports severe intermittent unilateral jaw pain that "pushes" his jaw over to one side. He reports it lessens for a few minutes, and then starts back up again. He has a h/o anxiety, and thinks his jaw pain is related. He has a h/o grinding his teeth at night, and occasionally uses a mouthgaurd. He has not seen a dentist in several years. He is currently taking only Celexa daily for anxiety, and says that Ativan and Xanax don't help. He was given Clonazepam to take TID in the ER for jaw pain and anxiety, and reports it has been working well.   Review of Systems   Past Medical History  Diagnosis Date  . Migraines   . Chicken pox   . Allergy   . Anxiety attack     Current Outpatient Prescriptions  Medication Sig Dispense Refill  . clonazePAM (KLONOPIN) 0.5 MG tablet Take 1 tablet (0.5 mg total) by mouth daily as needed for anxiety. 20 tablet 0  . naproxen (NAPROSYN) 500 MG tablet Take 500 mg by mouth as needed.    . citalopram (CELEXA) 20 MG tablet Take 1 tablet (20 mg total) by mouth daily. 30 tablet 2  . cyclobenzaprine (FLEXERIL) 10 MG tablet Take 1 tablet (10 mg total) by mouth 3 (three) times daily as needed for muscle spasms. 30 tablet 0   No current facility-administered medications for this visit.    Allergies  Allergen Reactions  . Tramadol Other (See Comments)    Altered mental status    Family History  Problem Relation Age of Onset  . Mental illness Mother   . Prostate cancer Father   . Prostate cancer Paternal Grandfather     Social History   Social History  . Marital Status: Married    Spouse Name: N/A  . Number of Children: N/A  . Years of Education: N/A   Occupational History  . Not on file.   Social History Main Topics  . Smoking status: Former Smoker      Types: Cigarettes  . Smokeless tobacco: Never Used     Comment: pt uses e-cig  . Alcohol Use: 0.0 oz/week    0 Standard drinks or equivalent per week     Comment: rare  . Drug Use: No  . Sexual Activity: Yes   Other Topics Concern  . Not on file   Social History Narrative     Constitutional: Denies fever, fatigue, or headache. HEENT: Pt reports unilateral jaw pain on the right side. Denies mouth pain, sore throat, etc.  Respiratory: Denies difficulty breathing, shortness of breath, or cough. Cardiovascular: Denies chest pain, chest tightness, or palpitations. Psych: Increased anxiety. Denies depression, SI/HI.  No other specific complaints in a complete review of systems (except as listed in HPI above).     Objective:   Physical Exam  BP 122/82 mmHg  Pulse 84  Temp(Src) 98.1 F (36.7 C) (Oral)  Wt 224 lb (101.606 kg) Wt Readings from Last 3 Encounters:  11/18/15 224 lb (101.606 kg)  11/14/15 219 lb (99.338 kg)  11/14/15 219 lb 8 oz (99.565 kg)    General: Appears his stated age, obese in NAD. HEENT: Head: normal shape and size; Throat/Mouth: Teeth present, mucosa pink and moist, no  exudate, lesions or ulcerations noted. No TTP of jaw, TMJ, or facial muscles. Cardiovascular: Normal rate and rhythm. S1,S2 noted.  No murmur, rubs or gallops noted.  Pulmonary/Chest: Normal effort and positive vesicular breath sounds. No respiratory distress. No wheezes, rales or ronchi noted.  Psych: He is very anxious today. He has repetitive thoughts.  BMET    Component Value Date/Time   NA 139 12/30/2013 1456   NA 136 03/14/2012 1725   K 4.0 12/30/2013 1456   K 4.0 03/14/2012 1725   CL 99 12/30/2013 1456   CL 100 03/14/2012 1725   CO2 26 12/30/2013 1456   CO2 33* 03/14/2012 1725   GLUCOSE 120* 12/30/2013 1456   GLUCOSE 101* 03/14/2012 1725   BUN 12 12/30/2013 1456   BUN 15 03/14/2012 1725   CREATININE 0.94 12/30/2013 1456   CREATININE 0.88 03/14/2012 1725   CALCIUM  10.0 12/30/2013 1456   CALCIUM 8.7 03/14/2012 1725   GFRNONAA >90 12/30/2013 1456   GFRNONAA >60 03/14/2012 1725   GFRAA >90 12/30/2013 1456   GFRAA >60 03/14/2012 1725    Lipid Panel  No results found for: CHOL, TRIG, HDL, CHOLHDL, VLDL, LDLCALC  CBC    Component Value Date/Time   WBC 9.7 12/30/2013 1456   WBC 12.8* 03/14/2012 1725   RBC 4.78 12/30/2013 1456   RBC 4.43 03/14/2012 1725   HGB 13.8 12/30/2013 1456   HGB 12.8* 03/14/2012 1725   HCT 39.8 12/30/2013 1456   HCT 37.2* 03/14/2012 1725   PLT 310 12/30/2013 1456   PLT 328 03/14/2012 1725   MCV 83.3 12/30/2013 1456   MCV 84 03/14/2012 1725   MCH 28.9 12/30/2013 1456   MCH 29.0 03/14/2012 1725   MCHC 34.7 12/30/2013 1456   MCHC 34.5 03/14/2012 1725   RDW 12.4 12/30/2013 1456   RDW 12.5 03/14/2012 1725   LYMPHSABS 3.2 12/30/2013 1456   MONOABS 0.5 12/30/2013 1456   EOSABS 0.3 12/30/2013 1456   BASOSABS 0.0 12/30/2013 1456    Hgb A1C No results found for: HGBA1C       Assessment & Plan:   ER follow-up for Jaw Pain and Anxiety:  ER notes, labs and procedures reviwed Schedule apt with dentist to r/o TMJ eRx for Flexeril 10 mg TID prn for jaw pain Increase Celexa to 20 mg daily D/C Ativan RX for Klonopin 0.5 mg daily prn- for anxiety attacks, not to be used for jaw pain He has scheduled an appt with psychiatry- have them send me their notes  RTC as needed or if symptoms persist or worsen

## 2015-11-18 NOTE — Progress Notes (Signed)
Pre visit review using our clinic review tool, if applicable. No additional management support is needed unless otherwise documented below in the visit note. 

## 2015-11-18 NOTE — Patient Instructions (Signed)
Generalized Anxiety Disorder Generalized anxiety disorder (GAD) is a mental disorder. It interferes with life functions, including relationships, work, and school. GAD is different from normal anxiety, which everyone experiences at some point in their lives in response to specific life events and activities. Normal anxiety actually helps us prepare for and get through these life events and activities. Normal anxiety goes away after the event or activity is over.  GAD causes anxiety that is not necessarily related to specific events or activities. It also causes excess anxiety in proportion to specific events or activities. The anxiety associated with GAD is also difficult to control. GAD can vary from mild to severe. People with severe GAD can have intense waves of anxiety with physical symptoms (panic attacks).  SYMPTOMS The anxiety and worry associated with GAD are difficult to control. This anxiety and worry are related to many life events and activities and also occur more days than not for 6 months or longer. People with GAD also have three or more of the following symptoms (one or more in children):  Restlessness.   Fatigue.  Difficulty concentrating.   Irritability.  Muscle tension.  Difficulty sleeping or unsatisfying sleep. DIAGNOSIS GAD is diagnosed through an assessment by your health care provider. Your health care provider will ask you questions aboutyour mood,physical symptoms, and events in your life. Your health care provider may ask you about your medical history and use of alcohol or drugs, including prescription medicines. Your health care provider may also do a physical exam and blood tests. Certain medical conditions and the use of certain substances can cause symptoms similar to those associated with GAD. Your health care provider may refer you to a mental health specialist for further evaluation. TREATMENT The following therapies are usually used to treat GAD:    Medication. Antidepressant medication usually is prescribed for long-term daily control. Antianxiety medicines may be added in severe cases, especially when panic attacks occur.   Talk therapy (psychotherapy). Certain types of talk therapy can be helpful in treating GAD by providing support, education, and guidance. A form of talk therapy called cognitive behavioral therapy can teach you healthy ways to think about and react to daily life events and activities.  Stress managementtechniques. These include yoga, meditation, and exercise and can be very helpful when they are practiced regularly. A mental health specialist can help determine which treatment is best for you. Some people see improvement with one therapy. However, other people require a combination of therapies.   This information is not intended to replace advice given to you by your health care provider. Make sure you discuss any questions you have with your health care provider.   Document Released: 11/07/2012 Document Revised: 08/03/2014 Document Reviewed: 11/07/2012 Elsevier Interactive Patient Education 2016 [employer]  

## 2015-12-05 ENCOUNTER — Other Ambulatory Visit: Payer: Self-pay | Admitting: Internal Medicine

## 2015-12-05 ENCOUNTER — Institutional Professional Consult (permissible substitution): Payer: BLUE CROSS/BLUE SHIELD | Admitting: Internal Medicine

## 2015-12-05 NOTE — Telephone Encounter (Signed)
Last filled 11/18/15--please advise 

## 2015-12-05 NOTE — Telephone Encounter (Signed)
Pt left v/m requesting refill clonazepam to [facility]. Last seen and last refilled # 20 on 11/18/15.Please advise.

## 2015-12-05 NOTE — Telephone Encounter (Signed)
I will not refill his Clonazepam before 5/24. I told him not to take this daily.

## 2015-12-05 NOTE — Telephone Encounter (Signed)
Flexeril sent electronically

## 2015-12-06 ENCOUNTER — Encounter: Payer: Self-pay | Admitting: *Deleted

## 2015-12-06 ENCOUNTER — Encounter: Payer: Self-pay | Admitting: Internal Medicine

## 2015-12-06 MED ORDER — CLONAZEPAM 0.5 MG PO TABS: 0.5000 mg | ORAL_TABLET | Freq: Every day | ORAL | Status: DC | PRN

## 2015-12-06 NOTE — Addendum Note (Signed)
Addended by: Roena MaladyEVONTENNO, MELANIE Y on: 12/06/2015 05:06 PM   Modules accepted: Orders

## 2015-12-06 NOTE — Telephone Encounter (Signed)
Pt states that this month is when he lost his son, also other stress related to work. 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

## 2015-12-18 ENCOUNTER — Ambulatory Visit (INDEPENDENT_AMBULATORY_CARE_PROVIDER_SITE_OTHER): Payer: BLUE CROSS/BLUE SHIELD | Admitting: Family Medicine

## 2015-12-18 ENCOUNTER — Encounter: Payer: Self-pay | Admitting: *Deleted

## 2015-12-18 ENCOUNTER — Encounter: Payer: Self-pay | Admitting: Family Medicine

## 2015-12-18 VITALS — BP 124/82 | HR 88 | Temp 97.9°F | Wt 221.5 lb

## 2015-12-18 DIAGNOSIS — R112 Nausea with vomiting, unspecified: Secondary | ICD-10-CM | POA: Diagnosis not present

## 2015-12-18 DIAGNOSIS — A059 Bacterial foodborne intoxication, unspecified: Secondary | ICD-10-CM

## 2015-12-18 MED ORDER — ONDANSETRON 4 MG PO TBDP
4.0000 mg | ORAL_TABLET | Freq: Once | ORAL | Status: AC
  Administered 2015-12-18: 4 mg via ORAL

## 2015-12-18 MED ORDER — ONDANSETRON HCL 4 MG PO TABS: 4.0000 mg | ORAL_TABLET | Freq: Three times a day (TID) | ORAL | Status: DC | PRN

## 2015-12-18 NOTE — Progress Notes (Signed)
   BP 124/82 mmHg  Pulse 88  Temp(Src) 97.9 F (36.6 C) (Oral)  Wt 221 lb 8 oz (100.472 kg)   CC: nausea/vomiting  Subjective:    Patient ID: Erik Aguilar, male    DOB: 10/02/1975, 40 y.o.   MRN: [id number]  HPI: Erik Aguilar is a 40 y.o. male presenting on 12/18/2015 for Emesis   Woke up yesterday 2am with nausea/vomiting. Tmax 101.9 last night. Unable to keep any food or drink down. Ongoing headache, malaise, fatigue, chills, sweats, body aches. Queasy stomach.   No diarrhea or bowel changes or blood in stool. No abd pain.   Has been treating with theraflu.  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  No recent travel. No recent alcohol.   He did take a few bites of suspicious chicken sandwich on Monday afternoon.  Relevant past medical, surgical, family and social history reviewed and updated as indicated. Interim medical history since our last visit reviewed. Allergies and medications reviewed and updated. Current Outpatient Prescriptions on File Prior to Visit  Medication Sig  . citalopram (CELEXA) 20 MG tablet Take 1 tablet (20 mg total) by mouth daily.  . clonazePAM (KLONOPIN) 0.5 MG tablet Take 1 tablet (0.5 mg total) by mouth daily as needed for anxiety.  . cyclobenzaprine (FLEXERIL) 10 MG tablet TAKE 1 TABLET (10 MG TOTAL) BY MOUTH 3 (THREE) TIMES DAILY AS NEEDED FOR MUSCLE SPASMS.  . naproxen (NAPROSYN) 500 MG tablet Take 500 mg by mouth as needed.   No current facility-administered medications on file prior to visit.    Review of Systems Per HPI unless specifically indicated in ROS section     Objective:    BP 124/82 mmHg  Pulse 88  Temp(Src) 97.9 F (36.6 C) (Oral)  Wt 221 lb 8 oz (100.472 kg)  Wt Readings from Last 3 Encounters:  12/18/15 221 lb 8 oz (100.472 kg)  11/18/15 224 lb (101.606 kg)  11/14/15 219 lb (99.338 kg)    Physical Exam  Constitutional: He appears well-developed and well-nourished. No distress.  HENT:  Mouth/Throat: Oropharynx is  clear and moist. No oropharyngeal exudate.  Eyes: Conjunctivae and EOM are normal. Pupils are equal, round, and reactive to light.  Cardiovascular: Normal rate, regular rhythm, normal heart sounds and intact distal pulses.   No murmur heard. Pulmonary/Chest: Effort normal and breath sounds normal. No respiratory distress. He has no wheezes. He has no rales.  Abdominal: Soft. Normal appearance and bowel sounds are normal. He exhibits no distension and no mass. There is no hepatosplenomegaly. There is generalized tenderness (mild). There is no rigidity, no rebound, no guarding, no CVA tenderness and negative Murphy's sign.  Skin: Skin is warm and dry. No rash noted.  Psychiatric: He has a normal mood and affect.  Nursing note and vitals reviewed.     Assessment & Plan:   Problem List Items Addressed This Visit    Food poisoning - Primary    Anticipate food poisoning to chicken sandwich he ate at home on monday. Monitor for dehydration.  Discussed supportive care. Questions answered.  Out of work until fever and vomiting/diarrhea free for 24 hours.           Follow up plan: Return if symptoms worsen or fail to improve.  Eustaquio BoydenJavier Tkeya Stencil, MD

## 2015-12-18 NOTE — Patient Instructions (Addendum)
You likely did have food poisoning.  Clear liquid diet for next few days - push fluids. zofran for nausea.  Let us know if not improving with treatment.  Food Poisoning Food poisoning is an illness caused by something you ate or drank. There are over 250 known causes of food poisoning. However, many other causes are unknown.You can be treated even if the exact cause of your food poisoning is not known. In most cases, food poisoning is mild and lasts 1 to 2 days. However, some cases can be serious, especially for people with low immune systems, the elderly, children and infants, and pregnant women. CAUSES  Poor personal hygiene, improper cleaning of storage and preparation areas, and unclean utensils can cause infection or tainting (contamination) of foods. The causes of food poisoning are numerous.Infectious agents, such as viruses, bacteria, or parasites, can cause harm by infecting the intestine and disrupting the absorption of nutrients and water. This can cause diarrhea and lead to dehydration. Viruses are responsible for most of the food poisonings in which an agent is found. Parasites are less likely to cause food poisoning. Toxic agents, such as poisonous mushrooms, marine algae, and pesticides can also cause food poisoning.  Viral causes of food poisoning include:  Norovirus.  Rotavirus.  Hepatitis A.  Bacterial causes of food poisoning include:  Salmonellae.  Campylobacter.  Bacillus cereus.  Escherichia coli (E. coli).  Shigella.  Listeria monocytogenes.  Clostridium botulinum (botulism).  Vibrio cholerae.  Parasites that can cause food poisoning include:  Giardia.  Cryptosporidium.  Toxoplasma. SYMPTOMS Symptoms may appear several hours or longer after consuming the contaminated food or drink. Symptoms may include:  Nausea.  Vomiting.  Cramping.  Diarrhea.  Fever and chills.  Muscle aches. DIAGNOSIS Your health care provider may be able to  diagnose food poisoning from a list of what you have recently eaten and results from lab tests. Diagnostic tests may include an exam of the feces. TREATMENT In most cases, treatment focuses on helping to relieve your symptoms and staying well hydrated. Antibiotic medicines are rarely needed. In severe cases, hospitalization may be required. HOME CARE INSTRUCTIONS   Drink enough water and fluids to keep your urine clear or pale yellow. Drink small amounts of fluids frequently and increase as tolerated.  Ask your health care provider for specific rehydration instructions.  Avoid:  Foods high in sugar.  Alcohol.  Carbonated drinks.  Tobacco.  Juice.  Caffeine drinks.  Extremely hot or cold fluids.  Fatty, greasy foods.  Too much intake of anything at one time.  Dairy products until 24 to 48 hours after diarrhea stops.  You may consume probiotics. Probiotics are active cultures of beneficial bacteria. They may lessen the amount and number of diarrheal stools in adults. Probiotics can be found in yogurt with active cultures and in supplements.  Wash your hands well to avoid spreading the bacteria.  Take medicines only as directed by your health care provider. Do not give your child aspirin because of the association with Reye's syndrome.  Ask your health care provider if you should continue to take your regular prescribed and over-the-counter medicines. PREVENTION   Wash your hands, food preparation surfaces, and utensils thoroughly before and after handling raw foods.  Keep refrigerated foods below 2F (5C).  Serve hot foods immediately or keep them heated above 12F (60C).  Divide large volumes of food into small portions for rapid cooling in the refrigerator. Hot, bulky foods in the refrigerator can raise the temperature of  other foods that have already cooled.  Follow approved canning procedures.  Heat canned foods thoroughly before tasting.  When in doubt,  throw it out.  Infants, the elderly, women who are pregnant, and people with compromised immune systems are especially susceptible to food poisoning. These people should never consume unpasteurized cheese, unpasteurized cider, raw fish, raw seafood, or raw meat-type products. SEEK IMMEDIATE MEDICAL CARE IF:   You have difficulty breathing, swallowing, talking, or moving.  You develop blurred vision.  You are unable to keep fluids down.  You faint or nearly faint.  Your eyes turn yellow.  Vomiting or diarrhea develops or becomes persistent.  Abdominal pain develops, increases, or localizes in one small area.  You have a fever.  The diarrhea becomes excessive or contains blood or mucus.  You develop excessive weakness, dizziness, or extreme thirst.  You have no urine for 8 hours. MAKE SURE YOU:   Understand these instructions.  Will watch your condition.  Will get help right away if you are not doing well or get worse.   This information is not intended to replace advice given to you by your health care provider. Make sure you discuss any questions you have with your health care provider.   Document Released: 04/10/2004 Document Revised: 08/03/2014 Document Reviewed: 01/14/2015 Elsevier Interactive Patient Education [employer]

## 2015-12-18 NOTE — Addendum Note (Signed)
Addended by: Josph MachoANCE, Talishia Betzler A on: 12/18/2015 09:56 AM   Modules accepted: Orders

## 2015-12-18 NOTE — Assessment & Plan Note (Signed)
Anticipate food poisoning to chicken sandwich he ate at home on monday. Monitor for dehydration.  Discussed supportive care. Questions answered.  Out of work until fever and vomiting/diarrhea free for 24 hours.

## 2015-12-18 NOTE — Progress Notes (Signed)
Pre visit review using our clinic review tool, if applicable. No additional management support is needed unless otherwise documented below in the visit note. 

## 2016-01-02 ENCOUNTER — Other Ambulatory Visit: Payer: Self-pay | Admitting: Internal Medicine

## 2016-01-03 MED ORDER — CYCLOBENZAPRINE HCL 10 MG PO TABS: ORAL_TABLET | ORAL | Status: DC

## 2016-01-03 MED ORDER — CLONAZEPAM 0.5 MG PO TABS
0.5000 mg | ORAL_TABLET | Freq: Every day | ORAL | Status: DC | PRN
Start: 2016-01-03 — End: 2016-01-08

## 2016-01-03 NOTE — Telephone Encounter (Signed)
Flexeril sent electronically. Ok to phone in Clonazepam to be filled 6/12 or after

## 2016-01-03 NOTE — Telephone Encounter (Signed)
Medication phoned to pharmacy.  

## 2016-01-03 NOTE — Telephone Encounter (Signed)
Electronic refill request. Last Filled:   Cyclobenzaprine  30 tablet 0 12/05/2015  Last Filled:   Clonazepam 20 tablet 0 12/06/2015  Last office visit:   12/18/15  Please advise.

## 2016-01-06 ENCOUNTER — Encounter: Payer: Self-pay | Admitting: Internal Medicine

## 2016-01-08 MED ORDER — CYCLOBENZAPRINE HCL 10 MG PO TABS: ORAL_TABLET | ORAL | Status: DC

## 2016-01-08 MED ORDER — CLONAZEPAM 0.5 MG PO TABS: 0.5000 mg | ORAL_TABLET | Freq: Every day | ORAL | Status: DC | PRN

## 2016-01-08 NOTE — Addendum Note (Signed)
Addended by: Roena MaladyEVONTENNO, Alyssandra Hulsebus Y on: 01/08/2016 11:29 AM   Modules accepted: Orders

## 2016-01-08 NOTE — Telephone Encounter (Signed)
Erik Aguilar [phone]  [name] dropped off a copy of the police report. Placed sticker on it and placed on cart.

## 2016-02-03 ENCOUNTER — Other Ambulatory Visit: Payer: Self-pay | Admitting: Internal Medicine

## 2016-02-03 ENCOUNTER — Encounter: Payer: Self-pay | Admitting: Internal Medicine

## 2016-02-03 MED ORDER — CYCLOBENZAPRINE HCL 10 MG PO TABS: ORAL_TABLET | ORAL | Status: DC

## 2016-02-03 MED ORDER — CLONAZEPAM 0.5 MG PO TABS
0.5000 mg | ORAL_TABLET | Freq: Every day | ORAL | Status: DC | PRN
Start: 2016-02-03 — End: 2016-03-05

## 2016-02-03 NOTE — Telephone Encounter (Signed)
Klonopin last filled 01/08/2016--please advise

## 2016-02-03 NOTE — Telephone Encounter (Signed)
Ok to have meds filled on 02/07/16

## 2016-02-07 NOTE — Telephone Encounter (Signed)
Rx called in to pharmacy. 

## 2016-03-05 ENCOUNTER — Other Ambulatory Visit: Payer: Self-pay | Admitting: Internal Medicine

## 2016-03-06 ENCOUNTER — Encounter: Payer: Self-pay | Admitting: Internal Medicine

## 2016-03-06 MED ORDER — CLONAZEPAM 0.5 MG PO TABS: 0.5000 mg | ORAL_TABLET | Freq: Every day | ORAL | 0 refills | Status: DC | PRN

## 2016-03-06 NOTE — Telephone Encounter (Signed)
Ok to phone in Clonazepam. Has he had UDS and CSA?

## 2016-03-06 NOTE — Telephone Encounter (Signed)
Last filled 02/03/2016--please advise

## 2016-03-06 NOTE — Telephone Encounter (Signed)
Pt is aware--pt states he will come in the next hour to complete the contract

## 2016-03-06 NOTE — Telephone Encounter (Signed)
Left message on voicemail pt needs to come in to sign a CSA as we do not have any on file

## 2016-03-06 NOTE — Telephone Encounter (Signed)
Rx called in to pharmacy. 

## 2016-03-13 ENCOUNTER — Other Ambulatory Visit: Payer: Self-pay | Admitting: Internal Medicine

## 2016-03-16 MED ORDER — CYCLOBENZAPRINE HCL 10 MG PO TABS: ORAL_TABLET | ORAL | 0 refills | Status: DC

## 2016-04-06 ENCOUNTER — Other Ambulatory Visit: Payer: Self-pay | Admitting: Internal Medicine

## 2016-04-06 MED ORDER — CLONAZEPAM 0.5 MG PO TABS: 0.5000 mg | ORAL_TABLET | Freq: Every day | ORAL | 0 refills | Status: DC | PRN

## 2016-04-06 NOTE — Telephone Encounter (Signed)
Per Baity okay to call in last filled 03/06/16

## 2016-05-05 ENCOUNTER — Other Ambulatory Visit: Payer: Self-pay | Admitting: Internal Medicine

## 2016-05-05 NOTE — Telephone Encounter (Signed)
Last filled 04/06/16--please advise

## 2016-05-06 ENCOUNTER — Encounter: Payer: Self-pay | Admitting: Internal Medicine

## 2016-05-06 ENCOUNTER — Other Ambulatory Visit: Payer: Self-pay | Admitting: Internal Medicine

## 2016-05-06 MED ORDER — CLONAZEPAM 0.5 MG PO TABS
0.5000 mg | ORAL_TABLET | Freq: Every day | ORAL | 0 refills | Status: DC | PRN
Start: 2016-05-06 — End: 2016-06-04

## 2016-05-06 NOTE — Telephone Encounter (Signed)
Ok to phone in Clonazepam 

## 2016-05-06 NOTE — Telephone Encounter (Signed)
Last refill was on 04/06/16 #20, last OV 11/18/15. Ok to refill?

## 2016-05-06 NOTE — Telephone Encounter (Signed)
[facility] per [facility] msg from pt

## 2016-06-04 ENCOUNTER — Other Ambulatory Visit: Payer: Self-pay | Admitting: Internal Medicine

## 2016-06-04 MED ORDER — CLONAZEPAM 0.5 MG PO TABS: 0.5000 mg | ORAL_TABLET | Freq: Every day | ORAL | 0 refills | Status: DC | PRN

## 2016-06-04 NOTE — Telephone Encounter (Signed)
Baity out of the office---Erik Aguilar last filled 05/06/2016---please advise if okay to call in to fill on or after 06/06/2016

## 2016-06-04 NOTE — Telephone Encounter (Signed)
plz phone in. 

## 2016-06-05 ENCOUNTER — Encounter: Payer: Self-pay | Admitting: Internal Medicine

## 2016-06-05 NOTE — Telephone Encounter (Signed)
Called in medication to the pharmacy as instructed. 

## 2016-07-03 ENCOUNTER — Other Ambulatory Visit: Payer: Self-pay | Admitting: Family Medicine

## 2016-07-03 MED ORDER — CLONAZEPAM 0.5 MG PO TABS: 0.5000 mg | ORAL_TABLET | Freq: Every day | ORAL | 0 refills | Status: DC | PRN

## 2016-07-03 NOTE — Telephone Encounter (Signed)
Ok to phone in Clonazepam to be filled on or after 07/04/16

## 2016-07-03 NOTE — Telephone Encounter (Signed)
Rx called in as directed.   

## 2016-07-03 NOTE — Telephone Encounter (Signed)
Rx called in to pharmacy. 

## 2016-07-03 NOTE — Telephone Encounter (Signed)
Last filled 06/04/16--please advise 

## 2016-08-06 ENCOUNTER — Other Ambulatory Visit: Payer: Self-pay | Admitting: Internal Medicine

## 2016-08-07 ENCOUNTER — Other Ambulatory Visit: Payer: Self-pay | Admitting: Internal Medicine

## 2016-08-07 NOTE — Telephone Encounter (Signed)
Ok to phone in Clonazepam 

## 2016-08-07 NOTE — Telephone Encounter (Signed)
Last filled 07/03/16--please advise 

## 2016-08-10 MED ORDER — CLONAZEPAM 0.5 MG PO TABS: 0.5000 mg | ORAL_TABLET | Freq: Every day | ORAL | 0 refills | Status: DC | PRN

## 2016-08-10 NOTE — Telephone Encounter (Signed)
Rx called in to pharmacy. 

## 2016-08-12 ENCOUNTER — Other Ambulatory Visit: Payer: Self-pay | Admitting: Internal Medicine

## 2016-08-14 ENCOUNTER — Other Ambulatory Visit: Payer: Self-pay | Admitting: Internal Medicine

## 2016-08-14 MED ORDER — CYCLOBENZAPRINE HCL 10 MG PO TABS: ORAL_TABLET | ORAL | 0 refills | Status: DC

## 2016-08-14 NOTE — Addendum Note (Signed)
Addended by: Roena MaladyEVONTENNO, Etoy Mcdonnell Y on: 08/14/2016 02:10 PM   Modules accepted: Orders

## 2016-08-14 NOTE — Telephone Encounter (Signed)
Last filled 03/16/16--please advise

## 2016-08-14 NOTE — Addendum Note (Signed)
Addended by: Roena MaladyEVONTENNO, Mckinnley Cottier Y on: 08/14/2016 02:11 PM   Modules accepted: Orders

## 2016-09-07 ENCOUNTER — Other Ambulatory Visit: Payer: Self-pay | Admitting: Internal Medicine

## 2016-09-07 MED ORDER — CLONAZEPAM 0.5 MG PO TABS: 0.5000 mg | ORAL_TABLET | Freq: Every day | ORAL | 0 refills | Status: DC | PRN

## 2016-09-07 MED ORDER — CYCLOBENZAPRINE HCL 10 MG PO TABS: ORAL_TABLET | ORAL | 0 refills | Status: DC

## 2016-09-07 NOTE — Telephone Encounter (Signed)
Klonopin last filled 08/10/16--last UDS 02/2016--please advise

## 2016-09-07 NOTE — Telephone Encounter (Signed)
Ok to phone in Clonazepam to be filled on or after 09/10/16

## 2016-09-09 NOTE — Telephone Encounter (Signed)
Rx called in to pharmacy. 

## 2016-10-01 ENCOUNTER — Other Ambulatory Visit: Payer: Self-pay | Admitting: Internal Medicine

## 2016-10-01 MED ORDER — CYCLOBENZAPRINE HCL 10 MG PO TABS: ORAL_TABLET | ORAL | 0 refills | Status: DC

## 2016-10-01 NOTE — Telephone Encounter (Signed)
Last filled 09/07/16--please advise

## 2016-10-02 ENCOUNTER — Encounter: Payer: Self-pay | Admitting: Internal Medicine

## 2016-10-07 ENCOUNTER — Other Ambulatory Visit: Payer: Self-pay | Admitting: Internal Medicine

## 2016-10-07 MED ORDER — CLONAZEPAM 0.5 MG PO TABS: 0.5000 mg | ORAL_TABLET | Freq: Every day | ORAL | 0 refills | Status: DC | PRN

## 2016-10-07 NOTE — Telephone Encounter (Signed)
Rx called in to pharmacy. 

## 2016-10-07 NOTE — Telephone Encounter (Signed)
Ok to phone in Clonazepam 

## 2016-10-07 NOTE — Telephone Encounter (Signed)
Last filled 09/07/2016--please advise

## 2016-10-16 ENCOUNTER — Ambulatory Visit: Payer: 59 | Admitting: Internal Medicine

## 2016-10-16 NOTE — Progress Notes (Deleted)
   Subjective:    Patient ID: Erik DickerMichael A Simonian, male    DOB: 11/13/1975, 41 y.o.   MRN: [id number]  HPI  Pt presents to the clinic today with headache. This started. The pain is located. He describes the pain as. He reports associated. He denies. He has tried.  Review of Systems  Past Medical History:  Diagnosis Date  . Allergy   . Anxiety attack   . Chicken pox   . Migraines     Current Outpatient Prescriptions  Medication Sig Dispense Refill  . citalopram (CELEXA) 20 MG tablet Take 1 tablet (20 mg total) by mouth daily. 30 tablet 2  . clonazePAM (KLONOPIN) 0.5 MG tablet Take 1 tablet (0.5 mg total) by mouth daily as needed for anxiety. 20 tablet 0  . cyclobenzaprine (FLEXERIL) 10 MG tablet TAKE 1 TABLET (10 MG TOTAL) BY MOUTH 3 (THREE) TIMES DAILY AS NEEDED FOR MUSCLE SPASMS. 30 tablet 0  . naproxen (NAPROSYN) 500 MG tablet Take 500 mg by mouth as needed.    . ondansetron (ZOFRAN) 4 MG tablet Take 1 tablet (4 mg total) by mouth 3 (three) times daily as needed for nausea or vomiting. 20 tablet 0   No current facility-administered medications for this visit.     Allergies  Allergen Reactions  . Tramadol Other (See Comments)    Altered mental status    Family History  Problem Relation Age of Onset  . Mental illness Mother   . Prostate cancer Father   . Prostate cancer Paternal Grandfather     Social History   Social History  . Marital status: Married    Spouse name: N/A  . Number of children: N/A  . Years of education: N/A   Occupational History  . Not on file.   Social History Main Topics  . Smoking status: Former Smoker    Types: Cigarettes  . Smokeless tobacco: Never Used     Comment: pt uses e-cig  . Alcohol use 0.0 oz/week     Comment: rare  . Drug use: No  . Sexual activity: Yes   Other Topics Concern  . Not on file   Social History Narrative  . No narrative on file     Constitutional: Denies fever, malaise, fatigue, headache or abrupt weight  changes.  HEENT: Denies eye pain, eye redness, ear pain, ringing in the ears, wax buildup, runny nose, nasal congestion, bloody nose, or sore throat. Respiratory: Denies difficulty breathing, shortness of breath, cough or sputum production.   Cardiovascular: Denies chest pain, chest tightness, palpitations or swelling in the hands or feet.  Gastrointestinal: Denies abdominal pain, bloating, constipation, diarrhea or blood in the stool.  GU: Denies urgency, frequency, pain with urination, burning sensation, blood in urine, odor or discharge. Musculoskeletal: Denies decrease in range of motion, difficulty with gait, muscle pain or joint pain and swelling.  Skin: Denies redness, rashes, lesions or ulcercations.  Neurological: Denies dizziness, difficulty with memory, difficulty with speech or problems with balance and coordination.  Psych: Denies anxiety, depression, SI/HI.  No other specific complaints in a complete review of systems (except as listed in HPI above).     Objective:   Physical Exam        Assessment & Plan:

## 2016-11-02 ENCOUNTER — Other Ambulatory Visit: Payer: Self-pay | Admitting: Internal Medicine

## 2016-11-02 NOTE — Telephone Encounter (Signed)
Last filled 10/07/16--please advise

## 2016-11-03 MED ORDER — CLONAZEPAM 0.5 MG PO TABS: 0.5000 mg | ORAL_TABLET | Freq: Every day | ORAL | 0 refills | Status: DC | PRN

## 2016-11-03 MED ORDER — CYCLOBENZAPRINE HCL 10 MG PO TABS: ORAL_TABLET | ORAL | 0 refills | Status: DC

## 2016-11-03 NOTE — Telephone Encounter (Signed)
Rx called in to requested pharmacy 

## 2016-11-03 NOTE — Telephone Encounter (Signed)
Ok to phone in Clonazepam and sent Flexeril electronically. When is he due for a followup?

## 2016-12-04 ENCOUNTER — Telehealth: Payer: Self-pay | Admitting: Internal Medicine

## 2016-12-04 MED ORDER — CLONAZEPAM 0.5 MG PO TABS: 0.5000 mg | ORAL_TABLET | Freq: Every day | ORAL | 0 refills | Status: DC | PRN

## 2016-12-04 MED ORDER — CYCLOBENZAPRINE HCL 10 MG PO TABS: ORAL_TABLET | ORAL | 0 refills | Status: DC

## 2016-12-04 NOTE — Telephone Encounter (Signed)
Erik Aguilar,[facility].  She has a question about the rx. Is it okay to fill the rx today or wait? Laurena SpiesMalia can be reached at [phone].

## 2016-12-04 NOTE — Telephone Encounter (Signed)
Last filled 11/03/2016... Please advise

## 2016-12-04 NOTE — Telephone Encounter (Signed)
Rx called in to pharmacy. 

## 2016-12-04 NOTE — Telephone Encounter (Signed)
Ok to phone in [place]ertonFlexeril and Clonazepam

## 2016-12-04 NOTE — Telephone Encounter (Signed)
Spoke to pharmacist confirmed medication to be filled on or after 12/07/16

## 2017-01-01 ENCOUNTER — Other Ambulatory Visit: Payer: Self-pay | Admitting: Internal Medicine

## 2017-01-01 NOTE — Telephone Encounter (Signed)
Last filled 12/04/16 please advise

## 2017-01-02 ENCOUNTER — Encounter (HOSPITAL_COMMUNITY): Payer: Self-pay | Admitting: Family Medicine

## 2017-01-02 ENCOUNTER — Ambulatory Visit (HOSPITAL_COMMUNITY)
Admission: EM | Admit: 2017-01-02 | Discharge: 2017-01-02 | Disposition: A | Discharge status: Home / Self Care | Payer: 59 | Attending: Internal Medicine | Admitting: Internal Medicine

## 2017-01-02 DIAGNOSIS — F419 Anxiety disorder, unspecified: Secondary | ICD-10-CM

## 2017-01-02 MED ORDER — CLONAZEPAM 0.5 MG PO TABS: 0.5000 mg | ORAL_TABLET | Freq: Three times a day (TID) | ORAL | 0 refills | Status: DC | PRN

## 2017-01-02 NOTE — ED Triage Notes (Signed)
Pt here for anxiety. sts he usually takes clonopin but very low dose.

## 2017-01-02 NOTE — Discharge Instructions (Signed)
Schedule an appointment with your primary care doctor to be seem early next week as discussed.  Take the prescribed medication as prescribed.

## 2017-01-02 NOTE — ED Provider Notes (Signed)
CSN: [id number]     Arrival date & time 01/02/17  1222 History   First MD Initiated Contact with Patient 01/02/17 1330     Chief Complaint  Patient presents with  . Anxiety   (Consider location/radiation/quality/duration/timing/severity/associated sxs/prior Treatment) Patient is a 41 y.o. Male, with hx of anxiety, presents today for severe anxiety for the past 2 days with shortness of breath, muscle tension, nervousness. Patient denies any particular trigger or reason. He normally takes Clonazepam 0.5 mg daily  PRN for his anxiety prescribed by his PCP however he is currently out of his clonazepam. Patient is requesting a limited amount of clonazepam to last him through the next few days and he has the plan to schedule an appointment with his PCP next week to further discuss anxiety management.       Past Medical History:  Diagnosis Date  . Allergy   . Anxiety attack   . Chicken pox   . Migraines    Past Surgical History:  Procedure Laterality Date  . NASAL SEPTUM SURGERY    . VASECTOMY  2012   Family History  Problem Relation Age of Onset  . Mental illness Mother   . Prostate cancer Father   . Prostate cancer Paternal Grandfather    Social History  Substance Use Topics  . Smoking status: Former Smoker    Types: Cigarettes  . Smokeless tobacco: Never Used     Comment: pt uses e-cig  . Alcohol use 0.0 oz/week     Comment: rare    Review of Systems  Constitutional:       As stated in the HPI    Allergies  Tramadol  Home Medications   Prior to Admission medications   Medication Sig Start Date End Date Taking? Authorizing Provider  clonazePAM (KLONOPIN) 0.5 MG tablet Take 1 tablet (0.5 mg total) by mouth 3 (three) times daily as needed for anxiety. 01/02/17   Lucia EstelleZheng, Elton Catalano, NP  cyclobenzaprine (FLEXERIL) 10 MG tablet TAKE 1 TABLET (10 MG TOTAL) BY MOUTH 3 (THREE) TIMES DAILY AS NEEDED FOR MUSCLE SPASMS. 12/04/16   Lorre MunroeBaity, Regina W, NP  naproxen (NAPROSYN) 500 MG tablet  Take 500 mg by mouth as needed.    [provider]  ondansetron (ZOFRAN) 4 MG tablet Take 1 tablet (4 mg total) by mouth 3 (three) times daily as needed for nausea or vomiting. 12/18/15   Eustaquio BoydenGutierrez, Javier, MD   Meds Ordered and Administered this Visit  Medications - No data to display  BP (!) 162/107   Pulse (!) 101   Temp 98.5 F (36.9 C)   Resp 18   SpO2 97%  No data found.   Physical Exam  Constitutional: He is oriented to person, place, and time. He appears well-developed and well-nourished.  Cardiovascular: Regular rhythm and normal heart sounds.   Tachycardia present  Pulmonary/Chest: Effort normal and breath sounds normal.  Abdominal: Soft. Bowel sounds are normal. There is no tenderness.  Neurological: He is alert and oriented to person, place, and time.  Skin: Skin is warm and dry. No rash noted.  Psychiatric:  Appears nervous and tense. But thought content and judgement are appropriate.   Nursing note and vitals reviewed.   Urgent Care Course     Procedures (including critical care time)  Labs Review Labs Reviewed - No data to display  Imaging Review No results found.  MDM   1. Anxiety    1) [place] controlled substance registry reviewed 2) Patient's request for a  limited amt of clonazepam seems reasonable to me 3) Rx given.  4) F/u with PCP next week.    Lucia Estelle, NP 01/02/17 1342

## 2017-01-04 MED ORDER — CYCLOBENZAPRINE HCL 10 MG PO TABS: ORAL_TABLET | ORAL | 0 refills | Status: DC

## 2017-02-07 ENCOUNTER — Ambulatory Visit (HOSPITAL_COMMUNITY)
Admission: EM | Admit: 2017-02-07 | Discharge: 2017-02-07 | Disposition: A | Discharge status: Home / Self Care | Payer: BLUE CROSS/BLUE SHIELD | Attending: Internal Medicine | Admitting: Internal Medicine

## 2017-02-07 ENCOUNTER — Encounter (HOSPITAL_COMMUNITY): Payer: Self-pay | Admitting: Family Medicine

## 2017-02-07 DIAGNOSIS — F411 Generalized anxiety disorder: Secondary | ICD-10-CM | POA: Diagnosis not present

## 2017-02-07 MED ORDER — HYDROXYZINE HCL 50 MG PO TABS: 25.0000 mg | ORAL_TABLET | Freq: Four times a day (QID) | ORAL | 0 refills | Status: AC | PRN

## 2017-02-07 NOTE — ED Triage Notes (Signed)
Pt here for anxiety. sts that he is out of klonopin and was suppose to see doctor Friday but insurance changes and now he needs a new doctor.

## 2017-02-07 NOTE — Discharge Instructions (Signed)
Start hydroxyzine 25-50mg  every 6 hours as needed for anxiety. Establish care with PCP for anxiety management and any referrals needed. Take Naproxen 500mg  BID x 10 days for muscle strain. You can use ice and heat compress.
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
## 2017-02-08 ENCOUNTER — Other Ambulatory Visit: Payer: Self-pay | Admitting: Internal Medicine

## 2017-02-08 NOTE — Telephone Encounter (Signed)
Last filled 01/04/17 last OV 11/18/2015 and looked at ED note from yesterday and it states pt was "suppose to see doctor Friday but insurance changes and now he needs a new doctor"

## 2017-02-09 MED ORDER — CYCLOBENZAPRINE HCL 10 MG PO TABS: ORAL_TABLET | ORAL | 0 refills | Status: DC

## 2017-04-19 ENCOUNTER — Other Ambulatory Visit: Payer: Self-pay | Admitting: Internal Medicine

## 2017-07-23 ENCOUNTER — Emergency Department (HOSPITAL_COMMUNITY): Payer: BLUE CROSS/BLUE SHIELD

## 2017-07-23 ENCOUNTER — Inpatient Hospital Stay (HOSPITAL_COMMUNITY): Payer: BLUE CROSS/BLUE SHIELD

## 2017-07-23 ENCOUNTER — Other Ambulatory Visit: Payer: Self-pay

## 2017-07-23 ENCOUNTER — Encounter (HOSPITAL_COMMUNITY): Payer: Self-pay | Admitting: *Deleted

## 2017-07-23 ENCOUNTER — Inpatient Hospital Stay (HOSPITAL_COMMUNITY)
Admission: EM | Admit: 2017-07-23 | Discharge: 2017-07-30 | DRG: 492 | Disposition: A | Discharge status: Rehabilitation Facility | Payer: BLUE CROSS/BLUE SHIELD | Attending: General Surgery | Admitting: General Surgery

## 2017-07-23 DIAGNOSIS — S82142A Displaced bicondylar fracture of left tibia, initial encounter for closed fracture: Secondary | ICD-10-CM | POA: Diagnosis not present

## 2017-07-23 DIAGNOSIS — Z87891 Personal history of nicotine dependence: Secondary | ICD-10-CM | POA: Diagnosis not present

## 2017-07-23 DIAGNOSIS — M549 Dorsalgia, unspecified: Secondary | ICD-10-CM | POA: Diagnosis present

## 2017-07-23 DIAGNOSIS — S065X9A Traumatic subdural hemorrhage with loss of consciousness of unspecified duration, initial encounter: Secondary | ICD-10-CM | POA: Diagnosis present

## 2017-07-23 DIAGNOSIS — Z8042 Family history of malignant neoplasm of prostate: Secondary | ICD-10-CM | POA: Diagnosis not present

## 2017-07-23 DIAGNOSIS — G8929 Other chronic pain: Secondary | ICD-10-CM | POA: Diagnosis present

## 2017-07-23 DIAGNOSIS — F411 Generalized anxiety disorder: Secondary | ICD-10-CM | POA: Diagnosis not present

## 2017-07-23 DIAGNOSIS — S0181XA Laceration without foreign body of other part of head, initial encounter: Secondary | ICD-10-CM | POA: Diagnosis present

## 2017-07-23 DIAGNOSIS — Y92410 Unspecified street and highway as the place of occurrence of the external cause: Secondary | ICD-10-CM | POA: Diagnosis not present

## 2017-07-23 DIAGNOSIS — D72829 Elevated white blood cell count, unspecified: Secondary | ICD-10-CM | POA: Diagnosis present

## 2017-07-23 DIAGNOSIS — S065XAA Traumatic subdural hemorrhage with loss of consciousness status unknown, initial encounter: Secondary | ICD-10-CM | POA: Diagnosis present

## 2017-07-23 DIAGNOSIS — S301XXA Contusion of abdominal wall, initial encounter: Secondary | ICD-10-CM | POA: Diagnosis present

## 2017-07-23 DIAGNOSIS — S0102XD Laceration with foreign body of scalp, subsequent encounter: Secondary | ICD-10-CM | POA: Diagnosis not present

## 2017-07-23 DIAGNOSIS — M25441 Effusion, right hand: Secondary | ICD-10-CM

## 2017-07-23 DIAGNOSIS — S0291XD Unspecified fracture of skull, subsequent encounter for fracture with routine healing: Secondary | ICD-10-CM | POA: Diagnosis not present

## 2017-07-23 DIAGNOSIS — Z720 Tobacco use: Secondary | ICD-10-CM | POA: Diagnosis not present

## 2017-07-23 DIAGNOSIS — R55 Syncope and collapse: Secondary | ICD-10-CM | POA: Diagnosis present

## 2017-07-23 DIAGNOSIS — F1120 Opioid dependence, uncomplicated: Secondary | ICD-10-CM | POA: Diagnosis present

## 2017-07-23 DIAGNOSIS — M7989 Other specified soft tissue disorders: Secondary | ICD-10-CM | POA: Diagnosis not present

## 2017-07-23 DIAGNOSIS — S065X9S Traumatic subdural hemorrhage with loss of consciousness of unspecified duration, sequela: Secondary | ICD-10-CM | POA: Diagnosis not present

## 2017-07-23 DIAGNOSIS — S069X9A Unspecified intracranial injury with loss of consciousness of unspecified duration, initial encounter: Secondary | ICD-10-CM | POA: Diagnosis not present

## 2017-07-23 DIAGNOSIS — R451 Restlessness and agitation: Secondary | ICD-10-CM | POA: Diagnosis present

## 2017-07-23 DIAGNOSIS — R402413 Glasgow coma scale score 13-15, at hospital admission: Secondary | ICD-10-CM | POA: Diagnosis present

## 2017-07-23 DIAGNOSIS — R000 Tachycardia, unspecified: Secondary | ICD-10-CM | POA: Diagnosis present

## 2017-07-23 DIAGNOSIS — I951 Orthostatic hypotension: Secondary | ICD-10-CM | POA: Diagnosis not present

## 2017-07-23 DIAGNOSIS — K5900 Constipation, unspecified: Secondary | ICD-10-CM | POA: Diagnosis present

## 2017-07-23 DIAGNOSIS — G43909 Migraine, unspecified, not intractable, without status migrainosus: Secondary | ICD-10-CM | POA: Diagnosis present

## 2017-07-23 DIAGNOSIS — S065X0S Traumatic subdural hemorrhage without loss of consciousness, sequela: Secondary | ICD-10-CM | POA: Diagnosis not present

## 2017-07-23 DIAGNOSIS — E669 Obesity, unspecified: Secondary | ICD-10-CM | POA: Diagnosis present

## 2017-07-23 DIAGNOSIS — I959 Hypotension, unspecified: Secondary | ICD-10-CM | POA: Diagnosis present

## 2017-07-23 DIAGNOSIS — S0003XA Contusion of scalp, initial encounter: Secondary | ICD-10-CM | POA: Diagnosis present

## 2017-07-23 DIAGNOSIS — R29700 NIHSS score 0: Secondary | ICD-10-CM | POA: Diagnosis present

## 2017-07-23 DIAGNOSIS — D62 Acute posthemorrhagic anemia: Secondary | ICD-10-CM | POA: Diagnosis present

## 2017-07-23 DIAGNOSIS — Z888 Allergy status to other drugs, medicaments and biological substances status: Secondary | ICD-10-CM

## 2017-07-23 DIAGNOSIS — I10 Essential (primary) hypertension: Secondary | ICD-10-CM | POA: Diagnosis present

## 2017-07-23 DIAGNOSIS — T148XXA Other injury of unspecified body region, initial encounter: Secondary | ICD-10-CM

## 2017-07-23 DIAGNOSIS — G8918 Other acute postprocedural pain: Secondary | ICD-10-CM | POA: Diagnosis not present

## 2017-07-23 DIAGNOSIS — Z6839 Body mass index (BMI) 39.0-39.9, adult: Secondary | ICD-10-CM

## 2017-07-23 DIAGNOSIS — S82142D Displaced bicondylar fracture of left tibia, subsequent encounter for closed fracture with routine healing: Secondary | ICD-10-CM | POA: Diagnosis not present

## 2017-07-23 DIAGNOSIS — S065X1S Traumatic subdural hemorrhage with loss of consciousness of 30 minutes or less, sequela: Secondary | ICD-10-CM | POA: Diagnosis not present

## 2017-07-23 DIAGNOSIS — E871 Hypo-osmolality and hyponatremia: Secondary | ICD-10-CM | POA: Diagnosis not present

## 2017-07-23 DIAGNOSIS — S82454A Nondisplaced comminuted fracture of shaft of right fibula, initial encounter for closed fracture: Secondary | ICD-10-CM | POA: Diagnosis present

## 2017-07-23 DIAGNOSIS — F1190 Opioid use, unspecified, uncomplicated: Secondary | ICD-10-CM

## 2017-07-23 DIAGNOSIS — S065X1D Traumatic subdural hemorrhage with loss of consciousness of 30 minutes or less, subsequent encounter: Secondary | ICD-10-CM | POA: Diagnosis present

## 2017-07-23 DIAGNOSIS — S82831A Other fracture of upper and lower end of right fibula, initial encounter for closed fracture: Secondary | ICD-10-CM | POA: Diagnosis present

## 2017-07-23 DIAGNOSIS — S82831D Other fracture of upper and lower end of right fibula, subsequent encounter for closed fracture with routine healing: Secondary | ICD-10-CM | POA: Diagnosis not present

## 2017-07-23 DIAGNOSIS — Z419 Encounter for procedure for purposes other than remedying health state, unspecified: Secondary | ICD-10-CM

## 2017-07-23 DIAGNOSIS — M79641 Pain in right hand: Secondary | ICD-10-CM | POA: Diagnosis present

## 2017-07-23 DIAGNOSIS — S82832A Other fracture of upper and lower end of left fibula, initial encounter for closed fracture: Secondary | ICD-10-CM

## 2017-07-23 DIAGNOSIS — Z79899 Other long term (current) drug therapy: Secondary | ICD-10-CM

## 2017-07-23 HISTORY — DX: Opioid dependence, uncomplicated: F11.20

## 2017-07-23 LAB — RAPID URINE DRUG SCREEN, HOSP PERFORMED
AMPHETAMINES: NOT DETECTED
BENZODIAZEPINES: POSITIVE — AB
Barbiturates: NOT DETECTED
COCAINE: NOT DETECTED
Opiates: NOT DETECTED
Tetrahydrocannabinol: NOT DETECTED

## 2017-07-23 LAB — ETHANOL

## 2017-07-23 LAB — CBC
HEMATOCRIT: 38.8 % — AB (ref 39.0–52.0)
Hemoglobin: 12.7 g/dL — ABNORMAL LOW (ref 13.0–17.0)
MCH: 28.9 pg (ref 26.0–34.0)
MCHC: 32.7 g/dL (ref 30.0–36.0)
MCV: 88.4 fL (ref 78.0–100.0)
Platelets: 389 10*3/uL (ref 150–400)
RBC: 4.39 MIL/uL (ref 4.22–5.81)
RDW: 12.9 % (ref 11.5–15.5)
WBC: 21 10*3/uL — ABNORMAL HIGH (ref 4.0–10.5)

## 2017-07-23 LAB — COMPREHENSIVE METABOLIC PANEL
ALBUMIN: 4 g/dL (ref 3.5–5.0)
ALT: 53 U/L (ref 17–63)
ANION GAP: 9 (ref 5–15)
AST: 45 U/L — ABNORMAL HIGH (ref 15–41)
Alkaline Phosphatase: 116 U/L (ref 38–126)
BILIRUBIN TOTAL: 0.4 mg/dL (ref 0.3–1.2)
BUN: 19 mg/dL (ref 6–20)
CO2: 19 mmol/L — ABNORMAL LOW (ref 22–32)
Calcium: 8.5 mg/dL — ABNORMAL LOW (ref 8.9–10.3)
Chloride: 107 mmol/L (ref 101–111)
Creatinine, Ser: 0.98 mg/dL (ref 0.61–1.24)
GFR calc non Af Amer: >60 mL/min (ref >60)
GLUCOSE: 142 mg/dL — AB (ref 65–99)
POTASSIUM: 3.9 mmol/L (ref 3.5–5.1)
SODIUM: 135 mmol/L (ref 135–145)
TOTAL PROTEIN: 6.6 g/dL (ref 6.5–8.1)

## 2017-07-23 MED ORDER — FENTANYL CITRATE (PF) 100 MCG/2ML IJ SOLN
50.0000 ug | Freq: Once | INTRAMUSCULAR | Status: AC
  Administered 2017-07-23: 50 ug via INTRAVENOUS
  Filled 2017-07-23: qty 2

## 2017-07-23 MED ORDER — PANTOPRAZOLE SODIUM 40 MG PO TBEC
40.0000 mg | DELAYED_RELEASE_TABLET | Freq: Every day | ORAL | Status: DC
  Administered 2017-07-23 – 2017-07-29 (×7): 40 mg via ORAL
  Filled 2017-07-23 (×7): qty 1

## 2017-07-23 MED ORDER — LIDOCAINE-EPINEPHRINE (PF) 2 %-1:200000 IJ SOLN
10.0000 mL | Freq: Once | INTRAMUSCULAR | Status: AC
  Administered 2017-07-23: 10 mL
  Filled 2017-07-23: qty 20

## 2017-07-23 MED ORDER — TRAMADOL HCL 50 MG PO TABS
100.0000 mg | ORAL_TABLET | Freq: Four times a day (QID) | ORAL | Status: DC
  Administered 2017-07-24 – 2017-07-28 (×13): 100 mg via ORAL
  Filled 2017-07-23 (×14): qty 2

## 2017-07-23 MED ORDER — ALPRAZOLAM 0.5 MG PO TABS
0.5000 mg | ORAL_TABLET | Freq: Two times a day (BID) | ORAL | Status: DC | PRN
  Administered 2017-07-23 – 2017-07-30 (×12): 0.5 mg via ORAL
  Filled 2017-07-23 (×2): qty 1
  Filled 2017-07-23: qty 2
  Filled 2017-07-23 (×5): qty 1
  Filled 2017-07-23: qty 2
  Filled 2017-07-23 (×3): qty 1

## 2017-07-23 MED ORDER — PANTOPRAZOLE SODIUM 40 MG IV SOLR: 40.0000 mg | Freq: Every day | INTRAVENOUS | Status: DC

## 2017-07-23 MED ORDER — HYDROMORPHONE HCL 1 MG/ML IJ SOLN: 0.5000 mg | INTRAMUSCULAR | Status: DC | PRN

## 2017-07-23 MED ORDER — SODIUM CHLORIDE 0.9% FLUSH
3.0000 mL | Freq: Two times a day (BID) | INTRAVENOUS | Status: DC
  Administered 2017-07-23 – 2017-07-30 (×10): 3 mL via INTRAVENOUS

## 2017-07-23 MED ORDER — HYDROMORPHONE HCL 1 MG/ML IJ SOLN
1.0000 mg | INTRAMUSCULAR | Status: DC | PRN
  Administered 2017-07-23: 2 mg via INTRAVENOUS
  Administered 2017-07-23: 1 mg via INTRAVENOUS
  Administered 2017-07-24: 2 mg via INTRAVENOUS
  Administered 2017-07-24: 1 mg via INTRAVENOUS
  Administered 2017-07-24 – 2017-07-26 (×13): 2 mg via INTRAVENOUS
  Filled 2017-07-23 (×12): qty 2
  Filled 2017-07-23: qty 1
  Filled 2017-07-23 (×2): qty 2
  Filled 2017-07-23: qty 1
  Filled 2017-07-23 (×2): qty 2

## 2017-07-23 MED ORDER — TETANUS-DIPHTH-ACELL PERTUSSIS 5-2.5-18.5 LF-MCG/0.5 IM SUSP
0.5000 mL | Freq: Once | INTRAMUSCULAR | Status: AC
  Administered 2017-07-23: 0.5 mL via INTRAMUSCULAR
  Filled 2017-07-23: qty 0.5

## 2017-07-23 MED ORDER — OXYCODONE HCL 5 MG PO TABS
10.0000 mg | ORAL_TABLET | ORAL | Status: DC | PRN
  Administered 2017-07-23 – 2017-07-26 (×12): 10 mg via ORAL
  Filled 2017-07-23 (×13): qty 2

## 2017-07-23 MED ORDER — BISACODYL 10 MG RE SUPP: 10.0000 mg | Freq: Every day | RECTAL | Status: DC | PRN

## 2017-07-23 MED ORDER — ONDANSETRON HCL 4 MG/2ML IJ SOLN: 4.0000 mg | Freq: Four times a day (QID) | INTRAMUSCULAR | Status: DC | PRN

## 2017-07-23 MED ORDER — FLUTICASONE PROPIONATE 50 MCG/ACT NA SUSP: 2.0000 | Freq: Every day | NASAL | Status: DC | PRN

## 2017-07-23 MED ORDER — SODIUM CHLORIDE 0.9 % IV SOLN: 250.0000 mL | INTRAVENOUS | Status: DC | PRN

## 2017-07-23 MED ORDER — SODIUM CHLORIDE 0.9% FLUSH
3.0000 mL | INTRAVENOUS | Status: DC | PRN
  Administered 2017-07-25: 3 mL via INTRAVENOUS
  Filled 2017-07-23: qty 3

## 2017-07-23 MED ORDER — OXYCODONE HCL 5 MG PO TABS: 5.0000 mg | ORAL_TABLET | ORAL | Status: DC | PRN

## 2017-07-23 MED ORDER — IOPAMIDOL (ISOVUE-300) INJECTION 61%
INTRAVENOUS | Status: AC
  Filled 2017-07-23: qty 100

## 2017-07-23 MED ORDER — ENOXAPARIN SODIUM 40 MG/0.4ML ~~LOC~~ SOLN
40.0000 mg | SUBCUTANEOUS | Status: DC
  Administered 2017-07-24 – 2017-07-30 (×6): 40 mg via SUBCUTANEOUS
  Filled 2017-07-23 (×6): qty 0.4

## 2017-07-23 MED ORDER — DOCUSATE SODIUM 100 MG PO CAPS
100.0000 mg | ORAL_CAPSULE | Freq: Two times a day (BID) | ORAL | Status: DC
  Administered 2017-07-24 – 2017-07-30 (×12): 100 mg via ORAL
  Filled 2017-07-23 (×13): qty 1

## 2017-07-23 MED ORDER — FENTANYL CITRATE (PF) 100 MCG/2ML IJ SOLN
100.0000 ug | Freq: Once | INTRAMUSCULAR | Status: AC
Start: 2017-07-23 — End: 2017-07-23
  Administered 2017-07-23: 100 ug via INTRAVENOUS
  Filled 2017-07-23: qty 2

## 2017-07-23 MED ORDER — ONDANSETRON 4 MG PO TBDP
4.0000 mg | ORAL_TABLET | Freq: Four times a day (QID) | ORAL | Status: DC | PRN
  Filled 2017-07-23: qty 1

## 2017-07-23 NOTE — ED Notes (Signed)
Family at bedside. 

## 2017-07-23 NOTE — ED Notes (Signed)
Attempted IV start x 2 unsuccessful. IV consult.

## 2017-07-23 NOTE — ED Notes (Signed)
Ginger ale given to  drink.   
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
## 2017-07-23 NOTE — ED Notes (Signed)
Pt called out and wanted something to drink; really thirsty
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
## 2017-07-23 NOTE — ED Notes (Signed)
Patient states he is refusing Ct scans , Dr. Rosalia Hammersay at bedside. Speaking extensive with patient about the importance of getting these xrays.
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
## 2017-07-23 NOTE — Progress Notes (Signed)
Orthopedic Tech Progress Note Patient Details:  Erik Aguilar 03/29/1976 [id number] Called [employer] for brace. Patient ID: Erik DickerMichael A Aguilar, male   DOB: 09/02/1975, 41 y.o.   MRN: [id number]   [name], [name] 07/23/2017, 4:24 PM

## 2017-07-23 NOTE — ED Notes (Signed)
Wounds cleaned with Saf-Clens

## 2017-07-23 NOTE — ED Notes (Signed)
Patient remains in xray 

## 2017-07-23 NOTE — ED Notes (Signed)
Patient transported to CT 
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
## 2017-07-23 NOTE — ED Notes (Signed)
Patient seen by Dr/ Lindie SpruceWyatt

## 2017-07-23 NOTE — Progress Notes (Signed)
Orthopedic Tech Progress Note Patient Details:  Erik DickerMichael A Canul 10/15/1975 [id number]  Ortho Devices Type of Ortho Device: Knee Immobilizer Ortho Device/Splint Location: LLE Ortho Device/Splint Interventions: Ordered, Application   Post Interventions Patient Tolerated: Well Instructions Provided: Care of device   [name], Dura Mccormack Craig 07/23/2017, 4:33 PM

## 2017-07-23 NOTE — Progress Notes (Signed)
3Orthopedic Tech Progress Note Patient Details:  Erik Aguilar 06/17/1976 [id number] Brace completed by [employer] brace to be left unlocked. Patient ID: Erik Aguilar, male   DOB: 09/05/1975, 41 y.o.   MRN: [id number]   [name], [name] Craig 07/23/2017, 5:08 PM

## 2017-07-23 NOTE — ED Notes (Signed)
Pt aware that a urine sample is needed.  

## 2017-07-24 LAB — BASIC METABOLIC PANEL
ANION GAP: 7 (ref 5–15)
BUN: 11 mg/dL (ref 6–20)
CO2: 24 mmol/L (ref 22–32)
Calcium: 8.2 mg/dL — ABNORMAL LOW (ref 8.9–10.3)
Chloride: 103 mmol/L (ref 101–111)
Creatinine, Ser: 0.81 mg/dL (ref 0.61–1.24)
GFR calc Af Amer: >60 mL/min (ref >60)
GLUCOSE: 142 mg/dL — AB (ref 65–99)
POTASSIUM: 3.8 mmol/L (ref 3.5–5.1)
Sodium: 134 mmol/L — ABNORMAL LOW (ref 135–145)

## 2017-07-24 LAB — CBC
HEMATOCRIT: 32.6 % — AB (ref 39.0–52.0)
Hemoglobin: 10.6 g/dL — ABNORMAL LOW (ref 13.0–17.0)
MCH: 28.1 pg (ref 26.0–34.0)
MCHC: 32.5 g/dL (ref 30.0–36.0)
MCV: 86.5 fL (ref 78.0–100.0)
PLATELETS: 280 10*3/uL (ref 150–400)
RBC: 3.77 MIL/uL — AB (ref 4.22–5.81)
RDW: 12.6 % (ref 11.5–15.5)
WBC: 10.7 10*3/uL — AB (ref 4.0–10.5)

## 2017-07-24 LAB — MRSA PCR SCREENING: MRSA by PCR: NEGATIVE

## 2017-07-24 NOTE — ED Notes (Signed)
Pt again concerned he will go into withdrawal d/t not having Suboxone, pt additionally asking for anxiety medication.

## 2017-07-24 NOTE — Progress Notes (Signed)
OT Cancellation Note  Patient Details Name: Erik DickerMichael A Aguilar MRN: [id number] DOB: 04/11/1976   Cancelled Treatment:    Reason Eval/Treat Not Completed: Patient at procedure or test/ unavailable. Pt currently in sx. OT will continue to follow for eval as schedule allows.  [name] 07/24/2017, 2:53 PM  [name] OTR/L [phone]

## 2017-07-24 NOTE — ED Notes (Signed)
Clear Liquid diet lunch tray ordered @ 1022.  

## 2017-07-24 NOTE — ED Notes (Signed)
PT at bedside with patient at this time

## 2017-07-24 NOTE — ED Notes (Signed)
Clear Liquid diet breakfast tray ordered @ 0402.

## 2017-07-24 NOTE — ED Notes (Signed)
Pt concerned regarding Suboxone administration, attempts made to explain to patient he would not go into withdrawal given the amount of narcotics being given.
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
## 2017-07-24 NOTE — ED Notes (Signed)
[facility] @ [phone].

## 2017-07-24 NOTE — ED Notes (Signed)
Delay in lab draw,  Pt using urinal. 

## 2017-07-24 NOTE — ED Notes (Signed)
Called to room by patient, pt requesting pain medication. Explained to pt PRN medication is not due again for another 30 minutes. Pt requesting RN be punctual with medication.

## 2017-07-24 NOTE — ED Notes (Signed)
Pt requesting PRN meds given routine. ;

## 2017-07-25 LAB — SURGICAL PCR SCREEN
MRSA, PCR: NEGATIVE
STAPHYLOCOCCUS AUREUS: NEGATIVE

## 2017-07-25 MED ORDER — POVIDONE-IODINE 10 % EX SWAB: 2.0000 "application " | Freq: Once | CUTANEOUS | Status: DC

## 2017-07-25 MED ORDER — DEXTROSE 5 % IV SOLN
3.0000 g | INTRAVENOUS | Status: AC
  Administered 2017-07-26: 3 g via INTRAVENOUS
  Filled 2017-07-25: qty 3000

## 2017-07-25 MED ORDER — CHLORHEXIDINE GLUCONATE 4 % EX LIQD
60.0000 mL | Freq: Once | CUTANEOUS | Status: DC
  Filled 2017-07-25: qty 60
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
## 2017-07-26 ENCOUNTER — Encounter (HOSPITAL_COMMUNITY): Admission: EM | Disposition: A | Discharge status: Rehabilitation Facility | Payer: Self-pay | Source: Home / Self Care

## 2017-07-26 ENCOUNTER — Encounter (HOSPITAL_COMMUNITY): Payer: Self-pay | Admitting: Orthopedic Surgery

## 2017-07-26 ENCOUNTER — Inpatient Hospital Stay (HOSPITAL_COMMUNITY): Payer: BLUE CROSS/BLUE SHIELD

## 2017-07-26 ENCOUNTER — Inpatient Hospital Stay (HOSPITAL_COMMUNITY): Payer: BLUE CROSS/BLUE SHIELD | Admitting: Anesthesiology

## 2017-07-26 ENCOUNTER — Other Ambulatory Visit: Payer: Self-pay

## 2017-07-26 HISTORY — PX: ORIF TIBIA PLATEAU: SHX2132

## 2017-07-26 SURGERY — OPEN REDUCTION INTERNAL FIXATION (ORIF) TIBIAL PLATEAU
Anesthesia: General | Site: Leg Lower | Laterality: Left

## 2017-07-26 MED ORDER — PROPOFOL 10 MG/ML IV BOLUS
INTRAVENOUS | Status: AC
  Filled 2017-07-26: qty 40

## 2017-07-26 MED ORDER — HYDROMORPHONE HCL 1 MG/ML IJ SOLN
INTRAMUSCULAR | Status: AC
  Administered 2017-07-26: 0.5 mg via INTRAVENOUS
  Filled 2017-07-26: qty 1

## 2017-07-26 MED ORDER — FENTANYL CITRATE (PF) 100 MCG/2ML IJ SOLN
INTRAMUSCULAR | Status: AC
  Administered 2017-07-26: 50 ug via INTRAVENOUS
  Filled 2017-07-26: qty 2

## 2017-07-26 MED ORDER — MIDAZOLAM HCL 2 MG/2ML IJ SOLN
INTRAMUSCULAR | Status: AC
  Administered 2017-07-26: 2 mg via INTRAVENOUS
  Filled 2017-07-26: qty 2

## 2017-07-26 MED ORDER — DEXMEDETOMIDINE HCL IN NACL 200 MCG/50ML IV SOLN
INTRAVENOUS | Status: DC | PRN
  Administered 2017-07-26: .5 ug/kg/h via INTRAVENOUS

## 2017-07-26 MED ORDER — HYDROMORPHONE HCL 1 MG/ML IJ SOLN
0.2500 mg | INTRAMUSCULAR | Status: DC | PRN
  Administered 2017-07-26 (×4): 0.5 mg via INTRAVENOUS

## 2017-07-26 MED ORDER — HYDROMORPHONE 1 MG/ML IV SOLN
INTRAVENOUS | Status: DC
  Administered 2017-07-26: 2.7 mg via INTRAVENOUS
  Administered 2017-07-26: 18:00:00 via INTRAVENOUS
  Administered 2017-07-27: 1.5 mg via INTRAVENOUS
  Administered 2017-07-27: 16:00:00 via INTRAVENOUS
  Administered 2017-07-27: 6.6 mg via INTRAVENOUS
  Administered 2017-07-28: 6.3 mg via INTRAVENOUS
  Administered 2017-07-28 (×2): via INTRAVENOUS
  Administered 2017-07-29: 5.1 mg via INTRAVENOUS
  Administered 2017-07-29: 2.1 mg via INTRAVENOUS
  Filled 2017-07-26 (×4): qty 25

## 2017-07-26 MED ORDER — MIDAZOLAM HCL 5 MG/5ML IJ SOLN
INTRAMUSCULAR | Status: DC | PRN
  Administered 2017-07-26: 2 mg via INTRAVENOUS

## 2017-07-26 MED ORDER — PHENYLEPHRINE 40 MCG/ML (10ML) SYRINGE FOR IV PUSH (FOR BLOOD PRESSURE SUPPORT)
PREFILLED_SYRINGE | INTRAVENOUS | Status: AC
  Filled 2017-07-26: qty 10

## 2017-07-26 MED ORDER — BACITRACIN ZINC 500 UNIT/GM EX OINT
TOPICAL_OINTMENT | CUTANEOUS | Status: AC
  Filled 2017-07-26: qty 28.35

## 2017-07-26 MED ORDER — MIDAZOLAM HCL 2 MG/2ML IJ SOLN
2.0000 mg | Freq: Once | INTRAMUSCULAR | Status: AC
  Administered 2017-07-26: 2 mg via INTRAVENOUS
  Filled 2017-07-26: qty 2

## 2017-07-26 MED ORDER — FENTANYL CITRATE (PF) 100 MCG/2ML IJ SOLN
50.0000 ug | Freq: Once | INTRAMUSCULAR | Status: AC
  Administered 2017-07-26: 50 ug via INTRAVENOUS

## 2017-07-26 MED ORDER — FENTANYL CITRATE (PF) 250 MCG/5ML IJ SOLN
INTRAMUSCULAR | Status: AC
  Filled 2017-07-26: qty 5

## 2017-07-26 MED ORDER — PROPOFOL 10 MG/ML IV BOLUS
INTRAVENOUS | Status: DC | PRN
  Administered 2017-07-26: 190 mg via INTRAVENOUS

## 2017-07-26 MED ORDER — BACITRACIN ZINC 500 UNIT/GM EX OINT
TOPICAL_OINTMENT | CUTANEOUS | Status: DC | PRN
  Administered 2017-07-26: 1 via TOPICAL

## 2017-07-26 MED ORDER — ONDANSETRON HCL 4 MG/2ML IJ SOLN
INTRAMUSCULAR | Status: AC
  Filled 2017-07-26: qty 2

## 2017-07-26 MED ORDER — MIDAZOLAM HCL 2 MG/2ML IJ SOLN
INTRAMUSCULAR | Status: AC
  Filled 2017-07-26: qty 2

## 2017-07-26 MED ORDER — FENTANYL CITRATE (PF) 100 MCG/2ML IJ SOLN
25.0000 ug | INTRAMUSCULAR | Status: DC | PRN
  Administered 2017-07-26 (×3): 50 ug via INTRAVENOUS

## 2017-07-26 MED ORDER — VANCOMYCIN HCL 1000 MG IV SOLR
INTRAVENOUS | Status: AC
  Filled 2017-07-26: qty 1000

## 2017-07-26 MED ORDER — SODIUM CHLORIDE 0.9% FLUSH: 9.0000 mL | INTRAVENOUS | Status: DC | PRN

## 2017-07-26 MED ORDER — ONDANSETRON HCL 4 MG/2ML IJ SOLN: 4.0000 mg | Freq: Four times a day (QID) | INTRAMUSCULAR | Status: DC | PRN

## 2017-07-26 MED ORDER — ONDANSETRON HCL 4 MG/2ML IJ SOLN: 4.0000 mg | Freq: Once | INTRAMUSCULAR | Status: DC | PRN

## 2017-07-26 MED ORDER — LIDOCAINE HCL (CARDIAC) 20 MG/ML IV SOLN
INTRAVENOUS | Status: DC | PRN
Start: 2017-07-26 — End: 2017-07-26
  Administered 2017-07-26: 50 mg via INTRAVENOUS

## 2017-07-26 MED ORDER — DIPHENHYDRAMINE HCL 12.5 MG/5ML PO ELIX: 12.5000 mg | ORAL_SOLUTION | Freq: Four times a day (QID) | ORAL | Status: DC | PRN

## 2017-07-26 MED ORDER — NALOXONE HCL 0.4 MG/ML IJ SOLN: 0.4000 mg | INTRAMUSCULAR | Status: DC | PRN

## 2017-07-26 MED ORDER — MIDAZOLAM HCL 2 MG/2ML IJ SOLN
2.0000 mg | Freq: Once | INTRAMUSCULAR | Status: AC
  Administered 2017-07-26: 2 mg via INTRAVENOUS

## 2017-07-26 MED ORDER — VANCOMYCIN HCL 1000 MG IV SOLR
INTRAVENOUS | Status: DC | PRN
  Administered 2017-07-26: 1000 mg

## 2017-07-26 MED ORDER — LACTATED RINGERS IV SOLN
INTRAVENOUS | Status: DC
  Administered 2017-07-26 (×3): via INTRAVENOUS

## 2017-07-26 MED ORDER — DEXAMETHASONE SODIUM PHOSPHATE 10 MG/ML IJ SOLN
INTRAMUSCULAR | Status: AC
  Filled 2017-07-26: qty 1

## 2017-07-26 MED ORDER — KETOROLAC TROMETHAMINE 15 MG/ML IJ SOLN
INTRAMUSCULAR | Status: AC
  Administered 2017-07-26: 12:00:00
  Filled 2017-07-26: qty 1

## 2017-07-26 MED ORDER — ROCURONIUM BROMIDE 100 MG/10ML IV SOLN
INTRAVENOUS | Status: DC | PRN
  Administered 2017-07-26: 60 mg via INTRAVENOUS
  Administered 2017-07-26: 20 mg via INTRAVENOUS

## 2017-07-26 MED ORDER — KETOROLAC TROMETHAMINE 15 MG/ML IJ SOLN
15.0000 mg | Freq: Four times a day (QID) | INTRAMUSCULAR | Status: DC
  Administered 2017-07-26 – 2017-07-30 (×16): 15 mg via INTRAVENOUS
  Filled 2017-07-26 (×16): qty 1

## 2017-07-26 MED ORDER — SUGAMMADEX SODIUM 500 MG/5ML IV SOLN
INTRAVENOUS | Status: DC | PRN
  Administered 2017-07-26: 300 mg via INTRAVENOUS

## 2017-07-26 MED ORDER — OXYCODONE HCL 5 MG PO TABS
15.0000 mg | ORAL_TABLET | ORAL | Status: DC | PRN
  Administered 2017-07-26 – 2017-07-30 (×6): 15 mg via ORAL
  Filled 2017-07-26 (×6): qty 3

## 2017-07-26 MED ORDER — 0.9 % SODIUM CHLORIDE (POUR BTL) OPTIME
TOPICAL | Status: DC | PRN
  Administered 2017-07-26: 1000 mL

## 2017-07-26 MED ORDER — DIPHENHYDRAMINE HCL 50 MG/ML IJ SOLN: 12.5000 mg | Freq: Four times a day (QID) | INTRAMUSCULAR | Status: DC | PRN

## 2017-07-26 MED ORDER — CEFAZOLIN SODIUM-DEXTROSE 2-4 GM/100ML-% IV SOLN
2.0000 g | Freq: Three times a day (TID) | INTRAVENOUS | Status: AC
  Administered 2017-07-26 – 2017-07-27 (×3): 2 g via INTRAVENOUS
  Filled 2017-07-26 (×3): qty 100

## 2017-07-26 MED ORDER — FENTANYL CITRATE (PF) 100 MCG/2ML IJ SOLN
INTRAMUSCULAR | Status: DC | PRN
  Administered 2017-07-26: 150 ug via INTRAVENOUS
  Administered 2017-07-26 (×2): 50 ug via INTRAVENOUS

## 2017-07-26 SURGICAL SUPPLY — 70 items
BANDAGE ACE 4X5 VEL STRL LF (GAUZE/BANDAGES/DRESSINGS) ×2 IMPLANT
BANDAGE ACE 6X5 VEL STRL LF (GAUZE/BANDAGES/DRESSINGS) ×2 IMPLANT
BANDAGE ESMARK 6X9 LF (GAUZE/BANDAGES/DRESSINGS) ×1 IMPLANT
BIT DRILL 2.5 X LONG (BIT) ×1
BIT DRILL CALIBR QC 2.5X250 (BIT) ×2 IMPLANT
BIT DRILL CALIBR QC 2.8X250 (BIT) ×2 IMPLANT
BIT DRILL X LONG 2.5 (BIT) ×1 IMPLANT
BLADE CLIPPER SURG (BLADE) IMPLANT
BLADE SURG 15 STRL LF DISP TIS (BLADE) ×1 IMPLANT
BLADE SURG 15 STRL SS (BLADE) ×1
BNDG ESMARK 6X9 LF (GAUZE/BANDAGES/DRESSINGS) ×2
BNDG GAUZE ELAST 4 BULKY (GAUZE/BANDAGES/DRESSINGS) ×2 IMPLANT
BRUSH SCRUB SURG 4.25 DISP (MISCELLANEOUS) ×4 IMPLANT
CANISTER SUCT 3000ML PPV (MISCELLANEOUS) ×2 IMPLANT
CHLORAPREP W/TINT 26ML (MISCELLANEOUS) ×2 IMPLANT
COVER SURGICAL LIGHT HANDLE (MISCELLANEOUS) ×2 IMPLANT
CUFF TOURNIQUET SINGLE 34IN LL (TOURNIQUET CUFF) IMPLANT
DRAPE C-ARM 42X72 X-RAY (DRAPES) ×2 IMPLANT
DRAPE C-ARMOR (DRAPES) ×2 IMPLANT
DRAPE ORTHO SPLIT 77X108 STRL (DRAPES) ×2
DRAPE SURG ORHT 6 SPLT 77X108 (DRAPES) ×2 IMPLANT
DRAPE U-SHAPE 47X51 STRL (DRAPES) ×2 IMPLANT
DRILL BIT X LONG 2.5 (BIT) ×1
DRSG ADAPTIC 3X8 NADH LF (GAUZE/BANDAGES/DRESSINGS) ×2 IMPLANT
DRSG PAD ABDOMINAL 8X10 ST (GAUZE/BANDAGES/DRESSINGS) ×4 IMPLANT
ELECT REM PT RETURN 9FT ADLT (ELECTROSURGICAL) ×2
ELECTRODE REM PT RTRN 9FT ADLT (ELECTROSURGICAL) ×1 IMPLANT
GAUZE SPONGE 4X4 12PLY STRL (GAUZE/BANDAGES/DRESSINGS) ×2 IMPLANT
GLOVE BIO SURGEON STRL SZ7.5 (GLOVE) ×8 IMPLANT
GLOVE BIOGEL PI IND STRL 7.5 (GLOVE) ×1 IMPLANT
GLOVE BIOGEL PI INDICATOR 7.5 (GLOVE) ×1
GOWN STRL REUS W/ TWL LRG LVL3 (GOWN DISPOSABLE) ×2 IMPLANT
GOWN STRL REUS W/TWL LRG LVL3 (GOWN DISPOSABLE) ×2
IMMOBILIZER KNEE 22 UNIV (SOFTGOODS) ×2 IMPLANT
KIT BASIN OR (CUSTOM PROCEDURE TRAY) ×2 IMPLANT
KIT ROOM TURNOVER OR (KITS) ×2 IMPLANT
NDL SUT 6 .5 CRC .975X.05 MAYO (NEEDLE) IMPLANT
NEEDLE MAYO TAPER (NEEDLE)
NS IRRIG 1000ML POUR BTL (IV SOLUTION) ×2 IMPLANT
PACK TOTAL JOINT (CUSTOM PROCEDURE TRAY) ×2 IMPLANT
PAD ARMBOARD 7.5X6 YLW CONV (MISCELLANEOUS) ×4 IMPLANT
PAD CAST 4YDX4 CTTN HI CHSV (CAST SUPPLIES) ×1 IMPLANT
PADDING CAST COTTON 4X4 STRL (CAST SUPPLIES) ×1
PADDING CAST COTTON 6X4 STRL (CAST SUPPLIES) ×2 IMPLANT
PLATE PROX TIBIA VA-LCP (Plate) ×2 IMPLANT
SCREW CORTEX 3.5 30MM (Screw) ×1 IMPLANT
SCREW CORTEX 3.5 32MM (Screw) ×2 IMPLANT
SCREW CORTEX 3.5X75MM (Screw) ×2 IMPLANT
SCREW LOCK CORT ST 3.5X30 (Screw) ×1 IMPLANT
SCREW LOCK CORT ST 3.5X32 (Screw) ×2 IMPLANT
SCREW LOCKING 3.5X80MM VA (Screw) ×4 IMPLANT
SCREW LOCKING VA 3.5X85MM (Screw) ×2 IMPLANT
STAPLER VISISTAT 35W (STAPLE) ×2 IMPLANT
SUCTION FRAZIER HANDLE 10FR (MISCELLANEOUS) ×1
SUCTION TUBE FRAZIER 10FR DISP (MISCELLANEOUS) ×1 IMPLANT
SUT ETHILON 3 0 PS 1 (SUTURE) ×4 IMPLANT
SUT MNCRL AB 3-0 PS2 18 (SUTURE) IMPLANT
SUT PROLENE 0 CT (SUTURE) IMPLANT
SUT PROLENE 0 CT 2 (SUTURE) IMPLANT
SUT VIC AB 0 CT1 18XCR BRD 8 (SUTURE) ×1 IMPLANT
SUT VIC AB 0 CT1 27 (SUTURE) ×1
SUT VIC AB 0 CT1 27XBRD ANBCTR (SUTURE) ×1 IMPLANT
SUT VIC AB 0 CT1 8-18 (SUTURE) ×1
SUT VIC AB 1 CT1 27 (SUTURE) ×1
SUT VIC AB 1 CT1 27XBRD ANBCTR (SUTURE) ×1 IMPLANT
SUT VIC AB 2-0 CT1 27 (SUTURE) ×2
SUT VIC AB 2-0 CT1 TAPERPNT 27 (SUTURE) ×2 IMPLANT
TOWEL OR 17X26 10 PK STRL BLUE (TOWEL DISPOSABLE) ×4 IMPLANT
TRAY FOLEY W/METER SILVER 16FR (SET/KITS/TRAYS/PACK) IMPLANT
WATER STERILE IRR 1000ML POUR (IV SOLUTION) ×4 IMPLANT
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
## 2017-07-26 NOTE — Anesthesia Postprocedure Evaluation (Signed)
Anesthesia Post Note  Patient: Erik Aguilar  Procedure(s) Performed: OPEN REDUCTION INTERNAL FIXATION (ORIF) TIBIAL PLATEAU (Left Leg Lower)     Patient location during evaluation: PACU Anesthesia Type: General Level of consciousness: awake, awake and alert and oriented Pain management: pain level controlled Vital Signs Assessment: post-procedure vital signs reviewed and stable Respiratory status: spontaneous breathing, nonlabored ventilation and respiratory function stable Anesthetic complications: no    Last Vitals:  Vitals:   07/26/17 1331 07/26/17 1350  BP: 105/62 132/80  Pulse: (!) 103 (!) 103  Resp: 16 16  Temp: 36.7 C 37 C  SpO2: 100% 96%    Last Pain:  Vitals:   07/26/17 1555  TempSrc:   PainSc: 8                  Alnita Aybar COKER

## 2017-07-26 NOTE — Anesthesia Procedure Notes (Signed)
Procedure Name: Intubation Date/Time: 07/26/2017 10:46 AM Performed by: Fransisca KaufmannMeyer, Zailynn Brandel E, CRNA Pre-anesthesia Checklist: Patient identified, Emergency Drugs available, Suction available and Patient being monitored Patient Re-evaluated:Patient Re-evaluated prior to induction Oxygen Delivery Method: Circle System Utilized Preoxygenation: Pre-oxygenation with 100% oxygen Induction Type: IV induction Ventilation: Mask ventilation without difficulty Laryngoscope Size: Miller Grade View: Grade I Tube type: Oral Tube size: 7.5 mm Number of attempts: 1 Airway Equipment and Method: Stylet and Oral airway Placement Confirmation: ETT inserted through vocal cords under direct vision,  positive ETCO2 and breath sounds checked- equal and bilateral Secured at: 23 cm Tube secured with: Tape Dental Injury: Teeth and Oropharynx as per pre-operative assessment
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
## 2017-07-26 NOTE — Progress Notes (Signed)
Dr. Jena GaussHaddix in room.  Informed about PCA orders via Trauma. Asked if ok to receive scheduled Toradol and Ultram with Dilaudid PCA. Dr. Jena GaussHaddix said ok to give scheduled meds with PCA.

## 2017-07-26 NOTE — Progress Notes (Signed)
Xrays in progress
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
## 2017-07-26 NOTE — Transfer of Care (Signed)
Immediate Anesthesia Transfer of Care Note  Patient: Erik Aguilar  Procedure(s) Performed: OPEN REDUCTION INTERNAL FIXATION (ORIF) TIBIAL PLATEAU (Left Leg Lower)  Patient Location: PACU  Anesthesia Type:General  Level of Consciousness: awake, alert , oriented and sedated  Airway & Oxygen Therapy: Patient Spontanous Breathing and Patient connected to nasal cannula oxygen  Post-op Assessment: Report given to RN, Post -op Vital signs reviewed and stable and Patient moving all extremities  Post vital signs: Reviewed and stable  Last Vitals:  Vitals:   07/26/17 0628 07/26/17 1217  BP: 102/61 125/79  Pulse: 100 (!) 108  Resp: 18 (!) 21  Temp: 36.9 C   SpO2: 97% 100%    Last Pain:  Vitals:   07/26/17 0958  TempSrc:   PainSc: 9       Patients Stated Pain Goal: 0 (07/25/17 1157)  Complications: No apparent anesthesia complications

## 2017-07-26 NOTE — Plan of Care (Signed)
  Progressing Education: Knowledge of General Education information will improve 07/26/2017 0414 - Progressing by Olena Materobinson, Marilyn Nihiser G, RN Note POC reviewed with pt. Pain Managment: General experience of comfort will improve 07/26/2017 0414 - Progressing by Olena Materobinson, Mikaiah Stoffer G, RN Note Pain management discussed.
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
## 2017-07-27 ENCOUNTER — Encounter (HOSPITAL_COMMUNITY): Payer: Self-pay | Admitting: Student

## 2017-07-27 DIAGNOSIS — S82831A Other fracture of upper and lower end of right fibula, initial encounter for closed fracture: Secondary | ICD-10-CM | POA: Diagnosis present

## 2017-07-27 DIAGNOSIS — S82142A Displaced bicondylar fracture of left tibia, initial encounter for closed fracture: Secondary | ICD-10-CM | POA: Diagnosis present

## 2017-07-27 DIAGNOSIS — F1190 Opioid use, unspecified, uncomplicated: Secondary | ICD-10-CM | POA: Insufficient documentation

## 2017-07-27 DIAGNOSIS — F1120 Opioid dependence, uncomplicated: Secondary | ICD-10-CM | POA: Insufficient documentation

## 2017-07-27 LAB — CBC
HEMATOCRIT: 29.1 % — AB (ref 39.0–52.0)
HEMOGLOBIN: 9.5 g/dL — AB (ref 13.0–17.0)
MCH: 28.7 pg (ref 26.0–34.0)
MCHC: 32.6 g/dL (ref 30.0–36.0)
MCV: 87.9 fL (ref 78.0–100.0)
Platelets: 333 10*3/uL (ref 150–400)
RBC: 3.31 MIL/uL — ABNORMAL LOW (ref 4.22–5.81)
RDW: 12.5 % (ref 11.5–15.5)
WBC: 10.9 10*3/uL — ABNORMAL HIGH (ref 4.0–10.5)
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
## 2017-07-27 NOTE — Progress Notes (Signed)
1 Day Post-Op   Subjective/Chief Complaint: Pt doing well this AM Pain better controlled.   Objective: Vital signs in last 24 hours: Temp:  [97.7 F (36.5 C)-98.6 F (37 C)] 98.4 F (36.9 C) (01/01 0400) Pulse Rate:  [95-108] 95 (01/01 0400) Resp:  [9-22] 12 (01/01 0902) BP: (104-132)/(57-81) 110/57 (01/01 0400) SpO2:  [87 %-100 %] 100 % (01/01 0902) Last BM Date: 07/23/17  Intake/Output from previous day: 12/31 0701 - 01/01 0700 In: 2247 [P.O.:840; I.V.:1157; IV Piggyback:250] Out: 1275 [Urine:1175; Blood:100] Intake/Output this shift: No intake/output data recorded.  Constitutional: No acute distress, conversant, appears states age. Eyes: Anicteric sclerae, moist conjunctiva, no lid lag, ecchy Lungs: Clear to auscultation bilaterally, normal respiratory effort CV: regular rate and rhythm, no murmurs, no peripheral edema, pedal pulses 2+ GI: Soft, no masses or hepatosplenomegaly, non-tender to palpation Skin: No rashes, palpation reveals normal turgor Ext: R knee immob, L LE in splint Psychiatric: appropriate judgment and insight, oriented to person, place, and time   Lab Results:  Recent Labs    07/27/17 0429  WBC 10.9*  HGB 9.5*  HCT 29.1*  PLT 333   Studies/Results: Dg Tibia/fibula Left  Result Date: 07/26/2017 CLINICAL DATA:  Tibial plateau fractures. EXAM: DG C-ARM 61-120 MIN; LEFT TIBIA AND FIBULA - 2 VIEW COMPARISON:  Radiographs and CT scan 07/23/2017 FINDINGS: Three fluoroscopic spot images demonstrate the long lateral tibial sideplate and transfixing screws. No complicating features. Anatomic reduction of the tibial plateau fractures. IMPRESSION: Open reduction and internal fixation of tibial plateau fractures with anatomic reduction and no complicating features. Electronically Signed   By: Rudie Meyer M.D.   On: 07/26/2017 12:19   Dg Tibia/fibula Left Port  Result Date: 07/26/2017 CLINICAL DATA:  Fracture of the proximal left tibia. EXAM: PORTABLE  LEFT TIBIA AND FIBULA - 2 VIEW COMPARISON:  Radiographs and CT scan dated 07/23/2017 FINDINGS: The patient has undergone open reduction and internal fixation of the comminuted fracture of the proximal left tibia. The hardware appears in excellent position. There is essentially anatomic alignment and position of the fracture fragments. IMPRESSION: Essentially anatomic alignment and position of the fracture fragments of the proximal tibia after open reduction and internal fixation. Electronically Signed   By: Francene Boyers M.D.   On: 07/26/2017 13:05   Dg C-arm 1-60 Min  Result Date: 07/26/2017 CLINICAL DATA:  Tibial plateau fractures. EXAM: DG C-ARM 61-120 MIN; LEFT TIBIA AND FIBULA - 2 VIEW COMPARISON:  Radiographs and CT scan 07/23/2017 FINDINGS: Three fluoroscopic spot images demonstrate the long lateral tibial sideplate and transfixing screws. No complicating features. Anatomic reduction of the tibial plateau fractures. IMPRESSION: Open reduction and internal fixation of tibial plateau fractures with anatomic reduction and no complicating features. Electronically Signed   By: Rudie Meyer M.D.   On: 07/26/2017 12:19    Anti-infectives: Anti-infectives (From admission, onward)   Start     Dose/Rate Route Frequency Ordered Stop   07/26/17 1800  ceFAZolin (ANCEF) IVPB 2g/100 mL premix     2 g 200 mL/hr over 30 Minutes Intravenous Every 8 hours 07/26/17 1358 07/27/17 2159   07/26/17 1141  vancomycin (VANCOCIN) powder  Status:  Discontinued       As needed 07/26/17 1141 07/26/17 1219   07/26/17 0945  ceFAZolin (ANCEF) 3 g in dextrose 5 % 50 mL IVPB     3 g 130 mL/hr over 30 Minutes Intravenous To ShortStay Surgical 07/25/17 1303 07/26/17 1045      Assessment/Plan: s/p Procedure(s) with  comments: OPEN REDUCTION INTERNAL FIXATION (ORIF) TIBIAL PLATEAU (Left) - available to move up Con't pain control Mobilize with PT with Ok'd with Ortho    LOS: 4 days    Erik Ehlersamirez Jr., [facility] 07/27/2017

## 2017-07-28 ENCOUNTER — Encounter (HOSPITAL_COMMUNITY): Payer: Self-pay | Admitting: *Deleted

## 2017-07-28 DIAGNOSIS — T148XXA Other injury of unspecified body region, initial encounter: Secondary | ICD-10-CM

## 2017-07-28 DIAGNOSIS — D62 Acute posthemorrhagic anemia: Secondary | ICD-10-CM

## 2017-07-28 DIAGNOSIS — S82142A Displaced bicondylar fracture of left tibia, initial encounter for closed fracture: Principal | ICD-10-CM

## 2017-07-28 DIAGNOSIS — S065X9A Traumatic subdural hemorrhage with loss of consciousness of unspecified duration, initial encounter: Secondary | ICD-10-CM

## 2017-07-28 DIAGNOSIS — G8918 Other acute postprocedural pain: Secondary | ICD-10-CM

## 2017-07-28 DIAGNOSIS — F411 Generalized anxiety disorder: Secondary | ICD-10-CM

## 2017-07-28 DIAGNOSIS — Z720 Tobacco use: Secondary | ICD-10-CM

## 2017-07-28 DIAGNOSIS — E871 Hypo-osmolality and hyponatremia: Secondary | ICD-10-CM

## 2017-07-28 MED ORDER — TRAMADOL HCL 50 MG PO TABS
50.0000 mg | ORAL_TABLET | ORAL | Status: DC
  Administered 2017-07-28 – 2017-07-29 (×4): 50 mg via ORAL
  Filled 2017-07-28 (×4): qty 1

## 2017-07-28 NOTE — Progress Notes (Signed)
Trauma Service Note  Subjective: Patient is doing ok.  Had a bowel movement., very had.  Objective: Vital signs in last 24 hours: Temp:  [97.5 F (36.4 C)-97.7 F (36.5 C)] 97.5 F (36.4 C) (01/02 0454) Pulse Rate:  [80-108] 80 (01/02 0454) Resp:  [13-18] 18 (01/02 0812) BP: (103-114)/(64-70) 103/67 (01/02 0454) SpO2:  [96 %-100 %] 97 % (01/02 0812) Last BM Date: 07/23/17  Intake/Output from previous day: 01/01 0701 - 01/02 0700 In: 700 [P.O.:600; IV Piggyback:100] Out: 600 [Urine:600] Intake/Output this shift: Total I/O In: -  Out: 200 [Urine:200]  General: Sister at bedside,   Patient on PCA and pain is relatively well controlled.    Lungs: Clear  Abd: Soft, good bowel sounds.  Extremities: No changes  Neuro: Intact  Lab Results: CBC  Recent Labs    07/27/17 0429  WBC 10.9*  HGB 9.5*  HCT 29.1*  PLT 333   BMET No results for input(s): NA, K, CL, CO2, GLUCOSE, BUN, CREATININE, CALCIUM in the last 72 hours. PT/INR No results for input(s): LABPROT, INR in the last 72 hours. ABG No results for input(s): PHART, HCO3 in the last 72 hours.  Invalid input(s): PCO2, PO2  Studies/Results: Dg Tibia/fibula Left  Result Date: 07/26/2017 CLINICAL DATA:  Tibial plateau fractures. EXAM: DG C-ARM 61-120 MIN; LEFT TIBIA AND FIBULA - 2 VIEW COMPARISON:  Radiographs and CT scan 07/23/2017 FINDINGS: Three fluoroscopic spot images demonstrate the long lateral tibial sideplate and transfixing screws. No complicating features. Anatomic reduction of the tibial plateau fractures. IMPRESSION: Open reduction and internal fixation of tibial plateau fractures with anatomic reduction and no complicating features. Electronically Signed   By: Rudie Meyer M.D.   On: 07/26/2017 12:19   Dg Tibia/fibula Left Port  Result Date: 07/26/2017 CLINICAL DATA:  Fracture of the proximal left tibia. EXAM: PORTABLE LEFT TIBIA AND FIBULA - 2 VIEW COMPARISON:  Radiographs and CT scan dated  07/23/2017 FINDINGS: The patient has undergone open reduction and internal fixation of the comminuted fracture of the proximal left tibia. The hardware appears in excellent position. There is essentially anatomic alignment and position of the fracture fragments. IMPRESSION: Essentially anatomic alignment and position of the fracture fragments of the proximal tibia after open reduction and internal fixation. Electronically Signed   By: Francene Boyers M.D.   On: 07/26/2017 13:05   Dg C-arm 1-60 Min  Result Date: 07/26/2017 CLINICAL DATA:  Tibial plateau fractures. EXAM: DG C-ARM 61-120 MIN; LEFT TIBIA AND FIBULA - 2 VIEW COMPARISON:  Radiographs and CT scan 07/23/2017 FINDINGS: Three fluoroscopic spot images demonstrate the long lateral tibial sideplate and transfixing screws. No complicating features. Anatomic reduction of the tibial plateau fractures. IMPRESSION: Open reduction and internal fixation of tibial plateau fractures with anatomic reduction and no complicating features. Electronically Signed   By: Rudie Meyer M.D.   On: 07/26/2017 12:19    Anti-infectives: Anti-infectives (From admission, onward)   Start     Dose/Rate Route Frequency Ordered Stop   07/26/17 1800  ceFAZolin (ANCEF) IVPB 2g/100 mL premix     2 g 200 mL/hr over 30 Minutes Intravenous Every 8 hours 07/26/17 1358 07/27/17 1605   07/26/17 1141  vancomycin (VANCOCIN) powder  Status:  Discontinued       As needed 07/26/17 1141 07/26/17 1219   07/26/17 0945  ceFAZolin (ANCEF) 3 g in dextrose 5 % 50 mL IVPB     3 g 130 mL/hr over 30 Minutes Intravenous To ShortStay Surgical 07/25/17 1303 07/26/17  1045      Assessment/Plan: s/p Procedure(s): OPEN REDUCTION INTERNAL FIXATION (ORIF) TIBIAL PLATEAU Advance diet Doing well.  Try to get off PCA soon.    CIR consultation.  LOS: 5 days   Marta LamasJames O. Gae BonWyatt, III, MD, FACS [phone] Trauma Surgeon 07/28/2017

## 2017-07-28 NOTE — Progress Notes (Addendum)
OT Cancellation Note  Patient Details Name: Erik DickerMichael A Aguilar MRN: [id number] DOB: 08/02/1975   Cancelled Treatment:    Reason Eval/Treat Not Completed: Other (comment); Pt declining OT tx at this time; reports he has been up to bathroom to have a BM and currently with increased fatigue after returning to bed, educated on importance and benefits of OOB activity throughout day with Pt verbalizing understanding and agreeable to OT checking back at another time; will check back as schedule permits.   Marcy SirenBreanna Hiilei Gerst, OT Pager [phone] 07/28/2017   Erik PennerBreanna L [name] 07/28/2017, 1:50 PM

## 2017-07-28 NOTE — Discharge Instructions (Signed)
Cast or Splint Care, Adult °Casts and splints are supports that are worn to protect broken bones and other injuries. A cast or splint may hold a bone still and in the correct position while it heals. Casts and splints may also help to ease pain, swelling, and muscle spasms. °How to care for your cast °· Do not stick anything inside the cast to scratch your skin. °· Check the skin around the cast every day. Tell your doctor about any concerns. °· You may put lotion on dry skin around the edges of the cast. Do not put lotion on the skin under the cast. °· Keep the cast clean. °· If the cast is not waterproof: °? Do not let it get wet. °? Cover it with a watertight covering when you take a bath or a shower. °How to care for your splint °· Wear it as told by your doctor. Take it off only as told by your doctor. °· Loosen the splint if your fingers or toes tingle, get numb, or turn cold and blue. °· Keep the splint clean. °· If the splint is not waterproof: °? Do not let it get wet. °? Cover it with a watertight covering when you take a bath or a shower. °Follow these instructions at home: °Bathing °· Do not take baths or swim until your doctor says it is okay. Ask your doctor if you can take showers. You may only be allowed to take sponge baths for bathing. °· If your cast or splint is not waterproof, cover it with a watertight covering when you take a bath or shower. °Managing pain, stiffness, and swelling °· Move your fingers or toes often to avoid stiffness and to lessen swelling. °· Raise (elevate) the injured area above the level of your heart while sitting or lying down. °Safety °· Do not use the injured limb to support your body weight until your doctor says that it is okay. °· Use crutches or other assistive devices as told by your doctor. °General instructions °· Do not put pressure on any part of the cast or splint until it is fully hardened. This may take many hours. °· Return to your normal activities as  told by your doctor. Ask your doctor what activities are safe for you. °· Keep all follow-up visits as told by your doctor. This is important. °Contact a doctor if: °· Your cast or splint gets damaged. °· The skin around the cast gets red or raw. °· The skin under the cast is very itchy or painful. °· Your cast or splint feels very uncomfortable. °· Your cast or splint is too tight or too loose. °· Your cast becomes wet or it starts to have a soft spot or area. °· You get an object stuck under your cast. °Get help right away if: °· Your pain gets worse. °· The injured area tingles, gets numb, or turns blue and cold. °· The part of your body above or below the cast is swollen and it turns a different color (is discolored). °· You cannot feel or move your fingers or toes. °· There is fluid leaking through the cast. °· You have very bad pain or pressure under the cast. °· You have trouble breathing. °· You have shortness of breath. °· You have chest pain. °This information is not intended to replace advice given to you by your health care provider. Make sure you discuss any questions you have with your health care provider. °Document Released: 11/12/2010 Document   Revised: 07/03/2016 Document Reviewed: 07/03/2016 °Elsevier Interactive Patient Education © 2017 [employer] Inc[employer] ° °1. PAIN CONTROL:  °1. Pain is best controlled by a usual combination of three different methods TOGETHER:  °1. Ice/Heat °2. Over the counter pain medication °3. Prescription pain medication °2. Most patients will experience some swelling and bruising around wounds. Ice packs or heating pads (30-60 minutes up to 6 times a day) will help. Use ice for the first few days to help decrease swelling and bruising, then switch to heat to help relax tight/sore spots and speed recovery. Some people prefer to use ice alone, heat alone, alternating between ice & heat. Experiment to what works for you. Swelling and bruising can take several weeks to resolve.    °3. It is helpful to take an over-the-counter pain medication regularly for the first few weeks. Choose one of the following that works best for you:  °1. Naproxen (Aleve, etc) Two 220mg tabs twice a day °2. Ibuprofen (Advil, etc) Three 200mg tabs four times a day (every meal & bedtime) °3. Acetaminophen (Tylenol, etc) 500-650mg four times a day (every meal & bedtime) °4. A prescription for pain medication (such as oxycodone, hydrocodone, etc) should be given to you upon discharge. Take your pain medication as prescribed.  °1. If you are having problems/concerns with the prescription medicine (does not control pain, nausea, vomiting, rash, itching, etc), please call us [phone] to see if we need to switch you to a different pain medicine that will work better for you and/or control your side effect better. °2. If you need a refill on your pain medication, please contact your pharmacy. They will contact our office to request authorization. Prescriptions will not be filled after 5 pm or on week-ends. °4. Avoid getting constipated. When taking pain medications, it is common to experience some constipation. Increasing fluid intake and taking a fiber supplement (such as Metamucil, Citrucel, FiberCon, MiraLax, etc) 1-2 times a day regularly will usually help prevent this problem from occurring. A mild laxative (prune juice, Milk of Magnesia, MiraLax, etc) should be taken according to package directions if there are no bowel movements after 48 hours.  °5. Watch out for diarrhea. If you have many loose bowel movements, simplify your diet to bland foods & liquids for a few days. Stop any stool softeners and decrease your fiber supplement. Switching to mild anti-diarrheal medications (Kayopectate, Pepto Bismol) can help. If this worsens or does not improve, please call us. °6. Wash / shower every day. You may shower daily and replace your bandges after showering. No bathing or submerging your wounds in water until they  heal. °7. FOLLOW UP in our office  °1. Please call [facility] at [phone] to set up an appointment for a follow-up appointment approximately 2-3 weeks after discharge for wound check ° °WHEN TO CALL US [phone]:  °1. Poor pain control °2. Reactions / problems with new medications (rash/itching, nausea, etc)  °3. Fever over 101.5 F (38.5 C) °4. Worsening swelling or bruising °5. Continued bleeding from wounds. °6. Increased pain, redness, or drainage from the wounds which could be signs of infection ° °The clinic staff is available to answer your questions during regular business hours (8:30am-5pm). Please don’t hesitate to call and ask to speak to one of our nurses for clinical concerns.  °If you have a medical emergency, go to the nearest emergency room or call 911.  °A surgeon from [facility] is always on call at the hospitals  ° °  [facility]  °[address], [address], [place], [place] [address] ?  °MAIN: [phone] ? TOLL FREE: [phone] ?  °FAX [phone]  °[email or web address] ° °

## 2017-07-28 NOTE — Progress Notes (Signed)
Inpatient Rehabilitation  Per PT request, patient was screened by Shalea Tomczak for appropriateness for an Inpatient Acute Rehab consult.  At this time we are recommending an Inpatient Rehab consult.  Text paged medical team to notify; please order if you are agreeable.    Fayette Gasner, M.A., CCC/SLP Admission Coordinator  [facility]  Cell [phone]  
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
## 2017-07-28 NOTE — Progress Notes (Signed)
PT Cancellation Note  Patient Details Name: Erik Aguilar MRN: [id number] DOB: 06/19/1976   Cancelled Treatment:    Reason Eval/Treat Not Completed: Other (comment) Patient refused this evening, citing fatigue. Wishes to work with therapy tomorrow and states he is motivated to particpiate.   Erik GrandchildSean [name], PT, DPT Acute Rehab Services Pager: [phone]     Erik GrandchildSean  Erik Aguilar 07/28/2017, 5:57 PM

## 2017-07-28 NOTE — Progress Notes (Signed)
PT Cancellation Note  Patient Details Name: Erik Aguilar MRN: [id number] DOB: 06/22/1976   Cancelled Treatment:    Reason Eval/Treat Not Completed: Other (comment) Pt declining PT at this time, citing fatigue and exhausting having just returned to bed from using bathroom. Patient agreeable for PT to check back in later this afternoon time permitting. Will re-attempt as schedule permits.   Etta GrandchildSean Merrik Puebla, PT, DPT Acute Rehab Services Pager: [phone]    Etta GrandchildSean  Dorina Ribaudo 07/28/2017, 4:12 PM

## 2017-07-28 NOTE — Plan of Care (Signed)
PCA pump on

## 2017-07-29 MED ORDER — HYDROMORPHONE HCL 1 MG/ML IJ SOLN
1.0000 mg | INTRAMUSCULAR | Status: DC | PRN
  Administered 2017-07-29 – 2017-07-30 (×4): 1 mg via INTRAVENOUS
  Filled 2017-07-29 (×4): qty 1

## 2017-07-29 MED ORDER — ACETAMINOPHEN 325 MG PO TABS
650.0000 mg | ORAL_TABLET | Freq: Four times a day (QID) | ORAL | Status: DC
  Administered 2017-07-29 – 2017-07-30 (×5): 650 mg via ORAL
  Filled 2017-07-29 (×5): qty 2

## 2017-07-29 MED ORDER — TRAMADOL HCL 50 MG PO TABS
100.0000 mg | ORAL_TABLET | Freq: Four times a day (QID) | ORAL | Status: DC
  Administered 2017-07-29 – 2017-07-30 (×5): 100 mg via ORAL
  Filled 2017-07-29 (×5): qty 2

## 2017-07-29 MED ORDER — HYDROMORPHONE HCL 1 MG/ML IJ SOLN: 1.0000 mg | INTRAMUSCULAR | Status: DC | PRN

## 2017-07-29 MED ORDER — METHOCARBAMOL 500 MG PO TABS
500.0000 mg | ORAL_TABLET | Freq: Four times a day (QID) | ORAL | Status: DC | PRN
  Administered 2017-07-29 (×2): 500 mg via ORAL
  Filled 2017-07-29 (×2): qty 1
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
## 2017-07-29 NOTE — Progress Notes (Signed)
Rehab admissions - I met with patient and gave him rehab booklets.  He would like to come to inpatient rehab and then may go home with ex-girlfriend at discharge.  I will need PT/OT progress and then can review for possible inpatient rehab admission.  Will need BCBS approval if he needs CIR.  Will await therapy updates.  Call me for questions.  #[phone]

## 2017-07-30 ENCOUNTER — Encounter (HOSPITAL_COMMUNITY): Payer: Self-pay

## 2017-07-30 ENCOUNTER — Inpatient Hospital Stay (HOSPITAL_COMMUNITY)
Admission: RE | Admit: 2017-07-30 | Discharge: 2017-08-06 | DRG: 945 | Disposition: A | Discharge status: Home Health | Payer: BLUE CROSS/BLUE SHIELD | Source: Intra-hospital | Attending: Physical Medicine & Rehabilitation | Admitting: Physical Medicine & Rehabilitation

## 2017-07-30 ENCOUNTER — Other Ambulatory Visit: Payer: Self-pay

## 2017-07-30 DIAGNOSIS — Z87891 Personal history of nicotine dependence: Secondary | ICD-10-CM

## 2017-07-30 DIAGNOSIS — F1120 Opioid dependence, uncomplicated: Secondary | ICD-10-CM | POA: Diagnosis present

## 2017-07-30 DIAGNOSIS — S0102XD Laceration with foreign body of scalp, subsequent encounter: Secondary | ICD-10-CM

## 2017-07-30 DIAGNOSIS — G8929 Other chronic pain: Secondary | ICD-10-CM | POA: Diagnosis present

## 2017-07-30 DIAGNOSIS — I951 Orthostatic hypotension: Secondary | ICD-10-CM | POA: Diagnosis not present

## 2017-07-30 DIAGNOSIS — M7989 Other specified soft tissue disorders: Secondary | ICD-10-CM | POA: Diagnosis not present

## 2017-07-30 DIAGNOSIS — I959 Hypotension, unspecified: Secondary | ICD-10-CM | POA: Diagnosis present

## 2017-07-30 DIAGNOSIS — K5900 Constipation, unspecified: Secondary | ICD-10-CM | POA: Diagnosis present

## 2017-07-30 DIAGNOSIS — Z8042 Family history of malignant neoplasm of prostate: Secondary | ICD-10-CM | POA: Diagnosis not present

## 2017-07-30 DIAGNOSIS — S065X1D Traumatic subdural hemorrhage with loss of consciousness of 30 minutes or less, subsequent encounter: Principal | ICD-10-CM

## 2017-07-30 DIAGNOSIS — S065X9A Traumatic subdural hemorrhage with loss of consciousness of unspecified duration, initial encounter: Secondary | ICD-10-CM | POA: Diagnosis present

## 2017-07-30 DIAGNOSIS — S82831D Other fracture of upper and lower end of right fibula, subsequent encounter for closed fracture with routine healing: Secondary | ICD-10-CM | POA: Diagnosis not present

## 2017-07-30 DIAGNOSIS — E871 Hypo-osmolality and hyponatremia: Secondary | ICD-10-CM | POA: Diagnosis present

## 2017-07-30 DIAGNOSIS — G43909 Migraine, unspecified, not intractable, without status migrainosus: Secondary | ICD-10-CM | POA: Diagnosis present

## 2017-07-30 DIAGNOSIS — S82142D Displaced bicondylar fracture of left tibia, subsequent encounter for closed fracture with routine healing: Secondary | ICD-10-CM | POA: Diagnosis not present

## 2017-07-30 DIAGNOSIS — F411 Generalized anxiety disorder: Secondary | ICD-10-CM | POA: Diagnosis present

## 2017-07-30 DIAGNOSIS — D62 Acute posthemorrhagic anemia: Secondary | ICD-10-CM | POA: Diagnosis present

## 2017-07-30 DIAGNOSIS — I10 Essential (primary) hypertension: Secondary | ICD-10-CM | POA: Diagnosis present

## 2017-07-30 DIAGNOSIS — R55 Syncope and collapse: Secondary | ICD-10-CM

## 2017-07-30 DIAGNOSIS — S065X0S Traumatic subdural hemorrhage without loss of consciousness, sequela: Secondary | ICD-10-CM | POA: Diagnosis not present

## 2017-07-30 DIAGNOSIS — M549 Dorsalgia, unspecified: Secondary | ICD-10-CM | POA: Diagnosis present

## 2017-07-30 DIAGNOSIS — S069X9A Unspecified intracranial injury with loss of consciousness of unspecified duration, initial encounter: Secondary | ICD-10-CM

## 2017-07-30 DIAGNOSIS — Y92410 Unspecified street and highway as the place of occurrence of the external cause: Secondary | ICD-10-CM | POA: Diagnosis not present

## 2017-07-30 DIAGNOSIS — D72829 Elevated white blood cell count, unspecified: Secondary | ICD-10-CM | POA: Diagnosis present

## 2017-07-30 DIAGNOSIS — S0291XD Unspecified fracture of skull, subsequent encounter for fracture with routine healing: Secondary | ICD-10-CM

## 2017-07-30 DIAGNOSIS — S065X1S Traumatic subdural hemorrhage with loss of consciousness of 30 minutes or less, sequela: Secondary | ICD-10-CM | POA: Diagnosis not present

## 2017-07-30 DIAGNOSIS — S065X9S Traumatic subdural hemorrhage with loss of consciousness of unspecified duration, sequela: Secondary | ICD-10-CM | POA: Diagnosis not present

## 2017-07-30 DIAGNOSIS — S82142A Displaced bicondylar fracture of left tibia, initial encounter for closed fracture: Secondary | ICD-10-CM | POA: Diagnosis not present

## 2017-07-30 DIAGNOSIS — S065XAA Traumatic subdural hemorrhage with loss of consciousness status unknown, initial encounter: Secondary | ICD-10-CM | POA: Diagnosis present

## 2017-07-30 LAB — CBC WITH DIFFERENTIAL/PLATELET
BASOS ABS: 0 10*3/uL (ref 0.0–0.1)
Basophils Relative: 0 %
Eosinophils Absolute: 0.3 10*3/uL (ref 0.0–0.7)
Eosinophils Relative: 2 %
HEMATOCRIT: 27.9 % — AB (ref 39.0–52.0)
HEMOGLOBIN: 9.1 g/dL — AB (ref 13.0–17.0)
LYMPHS PCT: 19 %
Lymphs Abs: 2.1 10*3/uL (ref 0.7–4.0)
MCH: 28.5 pg (ref 26.0–34.0)
MCHC: 32.6 g/dL (ref 30.0–36.0)
MCV: 87.5 fL (ref 78.0–100.0)
Monocytes Absolute: 0.8 10*3/uL (ref 0.1–1.0)
Monocytes Relative: 7 %
NEUTROS ABS: 8 10*3/uL — AB (ref 1.7–7.7)
NEUTROS PCT: 72 %
Platelets: 399 10*3/uL (ref 150–400)
RBC: 3.19 MIL/uL — AB (ref 4.22–5.81)
RDW: 12.6 % (ref 11.5–15.5)
WBC: 11.2 10*3/uL — AB (ref 4.0–10.5)

## 2017-07-30 LAB — COMPREHENSIVE METABOLIC PANEL
ALBUMIN: 2.8 g/dL — AB (ref 3.5–5.0)
ALK PHOS: 85 U/L (ref 38–126)
ALT: 54 U/L (ref 17–63)
ANION GAP: 9 (ref 5–15)
AST: 30 U/L (ref 15–41)
BUN: 12 mg/dL (ref 6–20)
CALCIUM: 8.4 mg/dL — AB (ref 8.9–10.3)
CO2: 27 mmol/L (ref 22–32)
Chloride: 99 mmol/L — ABNORMAL LOW (ref 101–111)
Creatinine, Ser: 0.66 mg/dL (ref 0.61–1.24)
GFR calc non Af Amer: >60 mL/min (ref >60)
GLUCOSE: 101 mg/dL — AB (ref 65–99)
POTASSIUM: 3.7 mmol/L (ref 3.5–5.1)
Sodium: 135 mmol/L (ref 135–145)
TOTAL PROTEIN: 5.9 g/dL — AB (ref 6.5–8.1)
Total Bilirubin: 0.8 mg/dL (ref 0.3–1.2)

## 2017-07-30 LAB — TSH: TSH: 2.21 u[IU]/mL (ref 0.350–4.500)

## 2017-07-30 MED ORDER — SORBITOL 70 % SOLN
30.0000 mL | Freq: Every day | Status: DC | PRN
  Administered 2017-07-31: 30 mL via ORAL
  Filled 2017-07-30 (×3): qty 30

## 2017-07-30 MED ORDER — FLUOXETINE HCL 20 MG PO CAPS
20.0000 mg | ORAL_CAPSULE | Freq: Every day | ORAL | Status: DC
  Administered 2017-07-31 – 2017-08-01 (×2): 20 mg via ORAL
  Filled 2017-07-30 (×2): qty 1

## 2017-07-30 MED ORDER — OXYCODONE HCL 5 MG PO TABS
15.0000 mg | ORAL_TABLET | ORAL | Status: DC | PRN
  Administered 2017-07-30 – 2017-08-06 (×33): 15 mg via ORAL
  Filled 2017-07-30 (×33): qty 3

## 2017-07-30 MED ORDER — ENOXAPARIN SODIUM 40 MG/0.4ML ~~LOC~~ SOLN: 40.0000 mg | SUBCUTANEOUS | Status: DC

## 2017-07-30 MED ORDER — HYDROMORPHONE HCL 1 MG/ML IJ SOLN
0.5000 mg | Freq: Four times a day (QID) | INTRAMUSCULAR | Status: DC | PRN
Start: 2017-07-30 — End: 2017-07-30
  Administered 2017-07-30: 0.5 mg via INTRAVENOUS
  Filled 2017-07-30: qty 1

## 2017-07-30 MED ORDER — METHOCARBAMOL 500 MG PO TABS
500.0000 mg | ORAL_TABLET | Freq: Three times a day (TID) | ORAL | Status: DC
  Administered 2017-07-30: 500 mg via ORAL
  Filled 2017-07-30: qty 1

## 2017-07-30 MED ORDER — METHOCARBAMOL 500 MG PO TABS
500.0000 mg | ORAL_TABLET | Freq: Three times a day (TID) | ORAL | Status: DC
  Administered 2017-07-30 – 2017-08-05 (×18): 500 mg via ORAL
  Filled 2017-07-30 (×19): qty 1

## 2017-07-30 MED ORDER — ENOXAPARIN SODIUM 40 MG/0.4ML ~~LOC~~ SOLN
40.0000 mg | SUBCUTANEOUS | Status: DC
  Administered 2017-07-31 – 2017-08-05 (×6): 40 mg via SUBCUTANEOUS
  Filled 2017-07-30 (×7): qty 0.4

## 2017-07-30 MED ORDER — PANTOPRAZOLE SODIUM 40 MG PO TBEC
40.0000 mg | DELAYED_RELEASE_TABLET | Freq: Every day | ORAL | Status: DC
  Administered 2017-07-30 – 2017-07-31 (×2): 40 mg via ORAL
  Filled 2017-07-30 (×5): qty 1

## 2017-07-30 MED ORDER — ONDANSETRON HCL 4 MG PO TABS
4.0000 mg | ORAL_TABLET | Freq: Four times a day (QID) | ORAL | Status: DC | PRN
  Administered 2017-07-30 – 2017-08-02 (×7): 4 mg via ORAL
  Filled 2017-07-30 (×7): qty 1

## 2017-07-30 MED ORDER — ONDANSETRON HCL 4 MG/2ML IJ SOLN: 4.0000 mg | Freq: Four times a day (QID) | INTRAMUSCULAR | Status: DC | PRN

## 2017-07-30 MED ORDER — DOCUSATE SODIUM 100 MG PO CAPS
100.0000 mg | ORAL_CAPSULE | Freq: Two times a day (BID) | ORAL | Status: DC
  Administered 2017-07-30 – 2017-08-02 (×5): 100 mg via ORAL
  Filled 2017-07-30 (×12): qty 1

## 2017-07-30 MED ORDER — ALPRAZOLAM 0.5 MG PO TABS
0.5000 mg | ORAL_TABLET | Freq: Two times a day (BID) | ORAL | Status: DC | PRN
  Administered 2017-07-30 – 2017-08-05 (×7): 0.5 mg via ORAL
  Filled 2017-07-30 (×7): qty 1

## 2017-07-30 MED ORDER — TRAMADOL HCL 50 MG PO TABS
100.0000 mg | ORAL_TABLET | Freq: Four times a day (QID) | ORAL | Status: DC
  Administered 2017-07-30 – 2017-08-02 (×10): 100 mg via ORAL
  Filled 2017-07-30 (×14): qty 2

## 2017-07-30 MED ORDER — HYDROMORPHONE HCL 1 MG/ML IJ SOLN: 1.0000 mg | Freq: Four times a day (QID) | INTRAMUSCULAR | Status: DC | PRN

## 2017-07-30 MED ORDER — FLUTICASONE PROPIONATE 50 MCG/ACT NA SUSP: 2.0000 | Freq: Every day | NASAL | Status: DC | PRN

## 2017-07-30 MED ORDER — BISACODYL 10 MG RE SUPP: 10.0000 mg | Freq: Every day | RECTAL | Status: DC | PRN

## 2017-07-30 MED ORDER — TOPIRAMATE 25 MG PO TABS
50.0000 mg | ORAL_TABLET | Freq: Every day | ORAL | Status: DC
  Administered 2017-07-30 – 2017-07-31 (×2): 50 mg via ORAL
  Filled 2017-07-30 (×4): qty 2

## 2017-07-30 MED ORDER — ACETAMINOPHEN 325 MG PO TABS
650.0000 mg | ORAL_TABLET | Freq: Four times a day (QID) | ORAL | Status: DC
  Administered 2017-07-30 – 2017-08-06 (×25): 650 mg via ORAL
  Filled 2017-07-30 (×27): qty 2
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
## 2017-07-30 NOTE — Progress Notes (Signed)
Rehab admissions - [facility] admission for today.  Bed available, patient agreeable and will admit to inpatient rehab today.  Call me for questions.  #[phone]
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
## 2017-07-30 NOTE — Progress Notes (Signed)
Patient was informed that he will be moving to rehab 4 west room 5.  Report was given to Lurena Joinerebecca, Charity fundraiserN.
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
## 2017-07-31 ENCOUNTER — Inpatient Hospital Stay (HOSPITAL_COMMUNITY): Payer: BLUE CROSS/BLUE SHIELD

## 2017-07-31 ENCOUNTER — Inpatient Hospital Stay (HOSPITAL_COMMUNITY): Payer: BLUE CROSS/BLUE SHIELD | Admitting: Physical Therapy

## 2017-07-31 ENCOUNTER — Inpatient Hospital Stay (HOSPITAL_COMMUNITY): Payer: BLUE CROSS/BLUE SHIELD | Admitting: Occupational Therapy

## 2017-07-31 DIAGNOSIS — M7989 Other specified soft tissue disorders: Secondary | ICD-10-CM

## 2017-07-31 DIAGNOSIS — S82142A Displaced bicondylar fracture of left tibia, initial encounter for closed fracture: Secondary | ICD-10-CM

## 2017-07-31 DIAGNOSIS — I959 Hypotension, unspecified: Secondary | ICD-10-CM

## 2017-07-31 DIAGNOSIS — S065X9S Traumatic subdural hemorrhage with loss of consciousness of unspecified duration, sequela: Secondary | ICD-10-CM

## 2017-07-31 NOTE — Progress Notes (Signed)
Bilateral lower extremity venous duplex has been completed. Negative for DVT.  07/31/17 2:23 PM Olen CordialGreg Ron Junco RVT
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
## 2017-07-31 NOTE — Progress Notes (Signed)
Physical Therapy Session Note  Patient Details  Name: Erik DickerMichael A Aguilar MRN: [id number] Date of Birth: 06/28/1976  Today's Date: 07/31/2017 PT Individual Time: 1420-1445 PT Individual Time Calculation (min): 25 min   Short Term Goals: Week 1:  PT Short Term Goal 1 (Week 1): STGs = LTGs  Skilled Therapeutic Interventions/Progress Updates:  Pt was seen bedside in the pm following doppler study. Pt transferred supine to edge of bed with head of bed elevated, side rail and S. Pt performed transfers bed to w/c, w/c to bed with c/s to min guard and rolling walker. Pt propelled w/c 150 feet x 2 with S and B UEs. Pt unwilling to attempt stairs secondary to fatigue and pain. Pt returned to room and left returned to bed with c/s to min guard with rolling walker. Pt left sitting up in bed with call bell within reach.   Therapy Documentation Precautions:  Precautions Precautions: Fall Required Braces or Orthoses: Other Brace/Splint Knee Immobilizer - Left: Other (comment) L knee immobilizer on when walking Other Brace/Splint: bledsoe brace R knee - locked at night, unlocked during the day for ROM Restrictions Weight Bearing Restrictions: Yes RLE Weight Bearing: Weight bearing as tolerated LLE Weight Bearing: Non weight bearing General: PT Amount of Missed Time (min): 20 Minutes PT Missed Treatment Reason: Other (Comment)(LE doppler study)  Pain: Pt c/o B LE pain s/p doppler.   See Function Navigator for Current Functional Status.   Therapy/Group: Individual Therapy  Rayford HalstedMitchell, Niki Cosman G 07/31/2017, 4:09 PM

## 2017-08-01 LAB — BASIC METABOLIC PANEL
ANION GAP: 7 (ref 5–15)
BUN: 8 mg/dL (ref 6–20)
CO2: 24 mmol/L (ref 22–32)
Calcium: 8.9 mg/dL (ref 8.9–10.3)
Chloride: 102 mmol/L (ref 101–111)
Creatinine, Ser: 0.84 mg/dL (ref 0.61–1.24)
GFR calc Af Amer: >60 mL/min (ref >60)
GFR calc non Af Amer: >60 mL/min (ref >60)
GLUCOSE: 113 mg/dL — AB (ref 65–99)
POTASSIUM: 3.8 mmol/L (ref 3.5–5.1)
Sodium: 133 mmol/L — ABNORMAL LOW (ref 135–145)

## 2017-08-01 LAB — CBC
HEMATOCRIT: 28.9 % — AB (ref 39.0–52.0)
Hemoglobin: 9.3 g/dL — ABNORMAL LOW (ref 13.0–17.0)
MCH: 28.1 pg (ref 26.0–34.0)
MCHC: 32.2 g/dL (ref 30.0–36.0)
MCV: 87.3 fL (ref 78.0–100.0)
Platelets: 442 10*3/uL — ABNORMAL HIGH (ref 150–400)
RBC: 3.31 MIL/uL — AB (ref 4.22–5.81)
RDW: 12.3 % (ref 11.5–15.5)
WBC: 12.9 10*3/uL — AB (ref 4.0–10.5)

## 2017-08-01 MED ORDER — PAROXETINE HCL 20 MG PO TABS
20.0000 mg | ORAL_TABLET | Freq: Every day | ORAL | Status: DC
  Administered 2017-08-01 – 2017-08-03 (×3): 20 mg via ORAL
  Filled 2017-08-01 (×4): qty 1

## 2017-08-01 MED ORDER — SENNOSIDES-DOCUSATE SODIUM 8.6-50 MG PO TABS
2.0000 | ORAL_TABLET | Freq: Two times a day (BID) | ORAL | Status: DC
  Administered 2017-08-01 – 2017-08-02 (×3): 2 via ORAL
  Filled 2017-08-01 (×7): qty 2

## 2017-08-01 NOTE — Plan of Care (Signed)
  Not Progressing RH BOWEL ELIMINATION RH STG MANAGE BOWEL W/MEDICATION W/ASSISTANCE Description STG Manage Bowel with Medication with Mod I Assistance.  08/01/2017 1054 - Not Progressing by Fabian SharpWhisner, Aashrith Eves
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
## 2017-08-01 NOTE — Plan of Care (Signed)
LBM  07/28/17
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
## 2017-08-02 ENCOUNTER — Inpatient Hospital Stay (HOSPITAL_COMMUNITY): Payer: BLUE CROSS/BLUE SHIELD | Admitting: Occupational Therapy

## 2017-08-02 ENCOUNTER — Inpatient Hospital Stay (HOSPITAL_COMMUNITY): Payer: BLUE CROSS/BLUE SHIELD

## 2017-08-02 DIAGNOSIS — I951 Orthostatic hypotension: Secondary | ICD-10-CM

## 2017-08-02 DIAGNOSIS — S065X0S Traumatic subdural hemorrhage without loss of consciousness, sequela: Secondary | ICD-10-CM

## 2017-08-02 LAB — CBC WITH DIFFERENTIAL/PLATELET
BASOS ABS: 0.1 10*3/uL (ref 0.0–0.1)
BASOS PCT: 1 %
EOS ABS: 0.1 10*3/uL (ref 0.0–0.7)
Eosinophils Relative: 1 %
HCT: 30.5 % — ABNORMAL LOW (ref 39.0–52.0)
Hemoglobin: 9.8 g/dL — ABNORMAL LOW (ref 13.0–17.0)
Lymphocytes Relative: 16 %
Lymphs Abs: 2 10*3/uL (ref 0.7–4.0)
MCH: 27.6 pg (ref 26.0–34.0)
MCHC: 32.1 g/dL (ref 30.0–36.0)
MCV: 85.9 fL (ref 78.0–100.0)
MONO ABS: 0.7 10*3/uL (ref 0.1–1.0)
Monocytes Relative: 6 %
Neutro Abs: 9.3 10*3/uL — ABNORMAL HIGH (ref 1.7–7.7)
Neutrophils Relative %: 76 %
Platelets: 488 10*3/uL — ABNORMAL HIGH (ref 150–400)
RBC: 3.55 MIL/uL — ABNORMAL LOW (ref 4.22–5.81)
RDW: 12.2 % (ref 11.5–15.5)
WBC: 12.1 10*3/uL — ABNORMAL HIGH (ref 4.0–10.5)

## 2017-08-02 LAB — ECHOCARDIOGRAM COMPLETE
Ao-asc: 31 cm
CHL CUP TV REG PEAK VELOCITY: 233 cm/s
EWDT: 176 ms
FS: 30 % (ref 28–44)
HEIGHTINCHES: 67 in
IV/PV OW: 1.09
LA vol index: 29.1 mL/m2
LA vol: 71.2 mL
LADIAMINDEX: 1.63 cm/m2
LASIZE: 40 mm
LAVOLA4C: 64.1 mL
LEFT ATRIUM END SYS DIAM: 40 mm
LV PW d: 11 mm — AB (ref 0.6–1.1)
LVOT area: 4.15 cm2
LVOT diameter: 23 mm
MV Dec: 176
MV pk E vel: 0.7 m/s
RV TAPSE: 22.5 mm
TR max vel: 233 cm/s
Weight: 4254 oz

## 2017-08-02 LAB — COMPREHENSIVE METABOLIC PANEL
ALBUMIN: 3.1 g/dL — AB (ref 3.5–5.0)
ALT: 42 U/L (ref 17–63)
AST: 27 U/L (ref 15–41)
Alkaline Phosphatase: 106 U/L (ref 38–126)
Anion gap: 11 (ref 5–15)
BUN: 9 mg/dL (ref 6–20)
CHLORIDE: 98 mmol/L — AB (ref 101–111)
CO2: 24 mmol/L (ref 22–32)
Calcium: 8.8 mg/dL — ABNORMAL LOW (ref 8.9–10.3)
Creatinine, Ser: 0.8 mg/dL (ref 0.61–1.24)
GFR calc Af Amer: >60 mL/min (ref >60)
GFR calc non Af Amer: >60 mL/min (ref >60)
Glucose, Bld: 130 mg/dL — ABNORMAL HIGH (ref 65–99)
POTASSIUM: 4 mmol/L (ref 3.5–5.1)
Sodium: 133 mmol/L — ABNORMAL LOW (ref 135–145)
Total Bilirubin: 1 mg/dL (ref 0.3–1.2)
Total Protein: 6.6 g/dL (ref 6.5–8.1)

## 2017-08-02 MED ORDER — BUPRENORPHINE HCL-NALOXONE HCL 8-2 MG SL SUBL
1.0000 | SUBLINGUAL_TABLET | Freq: Every day | SUBLINGUAL | Status: DC
Start: 2017-08-02 — End: 2017-08-03
  Administered 2017-08-02 – 2017-08-03 (×2): 1 via SUBLINGUAL
  Filled 2017-08-02 (×2): qty 1

## 2017-08-02 MED ORDER — BUPRENORPHINE HCL-NALOXONE HCL 8-2 MG SL SUBL: 2.0000 | SUBLINGUAL_TABLET | Freq: Every day | SUBLINGUAL | Status: DC

## 2017-08-02 NOTE — Progress Notes (Signed)
Patient information reviewed and entered into eRehab system by Ezrael Sam, RN, CRRN, PPS Coordinator.  Information including medical coding and functional independence measure will be reviewed and updated through discharge.    

## 2017-08-02 NOTE — Progress Notes (Signed)
Occupational Therapy Session Note  Patient Details  Name: Erik Aguilar MRN: [id number] Date of Birth: 08/14/1975  Today's Date: 08/02/2017 OT Individual Time: [id number] and 1510-1604 OT Individual Time Calculation (min): 84 min and 54 min    Short Term Goals: Week 1:  OT Short Term Goal 1 (Week 1): STG= LTG   Skilled Therapeutic Interventions/Progress Updates:    Session 1: Upon entering the room, pt with c/o nausea. RN arrived with medications. Pt agreeable to OT intervention. Pt propelled wheelchair 75' to pt laundry room and then ambulating with RW and min A to place dirty clothing items in washing machine with overall steady assistance for balance while pt maintained NWB on L LE. Pt propelled wheelchair to ADL apartment with supervision and increased time. Pt engaged in use of RW on carpeted surface for functional transfers similar to home environment. Pt performed recliner transfer, low sofa, and standard height bed transfer with side stepping as needed with RW with overall steady assistance. OT educated and demonstrated use of RW for transfer into tub shower with TTB. Pt returned demonstration with overall steady assistance as well. Pt returned to wheelchair and propelled self back to room. Pt remained in wheelchair with call bell and all needed items within reach upon exiting the room.     Session 2: Upon entering the room, pt supine in bed and required assistance to don bledsoe brace. Pt performed bed mobility with supervision and ambulated to bathroom for transfer onto TTB with steady assistance. Pt bathing from seated level with lateral leans to increase independence with self care tasks. Pt transferred onto commode for dressing tasks with education and min cues for use of reacher and sock aide in order to increase independence. Pt having difficulty with sock aide secondary to L foot edema.  Pt taking multiple rest breaks secondary to fatigue. Pt returning to bed in same manner as above.  Bed alarm activated and all needs within reach upon exiting the room.   Therapy Documentation Precautions:  Precautions Precautions: Fall Required Braces or Orthoses: Other Brace/Splint Knee Immobilizer - Left: (Discontinue per ortho 1/7) Other Brace/Splint: bledsoe brace unlocked RLE for OOB; does not need brace in bed Restrictions Weight Bearing Restrictions: Yes RLE Weight Bearing: Weight bearing as tolerated(with Bledsoe brace) LLE Weight Bearing: Non weight bearing    See Function Navigator for Current Functional Status.   Therapy/Group: Individual Therapy  [name], Domanick Cuccia P 08/02/2017, 12:45 PM
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
## 2017-08-02 NOTE — Progress Notes (Signed)
  Echocardiogram 2D Echocardiogram has been performed.  Erik Aguilar 08/02/2017, 2:53 PM

## 2017-08-02 NOTE — Progress Notes (Signed)
Physical Therapy Session Note  Patient Details  Name: Erik DickerMichael A Torrens MRN: [id number] Date of Birth: 05/29/1976  Today's Date: 08/02/2017 PT Individual Time: 0915-1015 PT Individual Time Calculation (min): 60 min   Short Term Goals: Week 1:  PT Short Term Goal 1 (Week 1): STGs = LTGs  Skilled Therapeutic Interventions/Progress Updates:    d/c planning discussed initial part of session especially in regards to options for stair negotiation upon d/c. Pt plans to find out what actual stair set-up is including access to railings. Pt thinks there is no rails on the first set of steps and possible 1 rail on the other. Discussed options and pt able to realize that bumping on bottom is an option but getting up from the floor at the top of the steps would be challenging. Decline attempting practicing this morning due to "feeling weak" Pt impulsive throughout session with mobility and requires cues for safety and adherence to use of brace on R. Transferred stand pivot bed -> w/c and w/c <> toilet using grab bar with close supervision to steadying assist and pt managing clothing independently. Brace removed to change clothing but pt continued to stand on RLE despite brace not being reapplied yet. W/c mobility on unit with cues for efficiency and technique with supervision and for functional UE strengthening and endurance. Gait training with RW x 50' with close supervision to min assist for turning due to impulsivity and balance deficits. Pt able to maintain NWB status without cues. In supine focused on BLE strengthening and ROM with emphasis on LLE including ankle pumps, heel slides, hip abduction, and SAQ with 5 second hold x 15 reps each and active assisted movement on LLE needed due to pain and weakness. In seated position, instructed in 15 reps of heel slides with pillow case under foot for increasing L knee flexion ROM.   Therapy Documentation Precautions:  Precautions Precautions: Fall Required Braces or  Orthoses: Other Brace/Splint Knee Immobilizer - Left: (Discontinue per ortho 1/7) Other Brace/Splint: bledsoe brace unlocked RLE for OOB; does not need brace in bed Restrictions Weight Bearing Restrictions: Yes RLE Weight Bearing: Weight bearing as tolerated(with Bledsoe brace) LLE Weight Bearing: Non weight bearing    Pain:  Reports pain throughout and reports discussing medication changes with MD this AM. Pt just received pain medication by RN.     See Function Navigator for Current Functional Status.   Therapy/Group: Individual Therapy  Karolee StampsGray, Yarisbel Miranda Darrol PokeBrescia  Kodi Steil B. Posie Lillibridge, PT, DPT  08/02/2017, 12:03 PM

## 2017-08-03 ENCOUNTER — Inpatient Hospital Stay (HOSPITAL_COMMUNITY): Payer: BC Managed Care – PPO | Admitting: Occupational Therapy

## 2017-08-03 ENCOUNTER — Inpatient Hospital Stay (HOSPITAL_COMMUNITY): Payer: BLUE CROSS/BLUE SHIELD | Admitting: Occupational Therapy

## 2017-08-03 ENCOUNTER — Inpatient Hospital Stay (HOSPITAL_COMMUNITY): Payer: BC Managed Care – PPO

## 2017-08-03 ENCOUNTER — Inpatient Hospital Stay (HOSPITAL_COMMUNITY): Payer: BLUE CROSS/BLUE SHIELD

## 2017-08-03 MED ORDER — TRAZODONE HCL 50 MG PO TABS
50.0000 mg | ORAL_TABLET | Freq: Every evening | ORAL | Status: DC | PRN
  Filled 2017-08-03: qty 1

## 2017-08-03 MED ORDER — POLYETHYLENE GLYCOL 3350 17 G PO PACK
17.0000 g | PACK | Freq: Every day | ORAL | Status: DC | PRN
  Administered 2017-08-03 – 2017-08-05 (×3): 17 g via ORAL
  Filled 2017-08-03 (×2): qty 1

## 2017-08-03 MED ORDER — METHADONE HCL 10 MG PO TABS
10.0000 mg | ORAL_TABLET | Freq: Two times a day (BID) | ORAL | Status: DC
  Administered 2017-08-04 – 2017-08-06 (×5): 10 mg via ORAL
  Filled 2017-08-03 (×5): qty 1
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
## 2017-08-03 NOTE — Progress Notes (Signed)
Occupational Therapy Session Note  Patient Details  Name: Erik Aguilar MRN: [id number] Date of Birth: 04/06/1976  Today's Date: 08/03/2017 OT Individual Time: [id number] OT Individual Time Calculation (min): 72 min    Short Term Goals: Week 1:  OT Short Term Goal 1 (Week 1): STG= LTG  Skilled Therapeutic Interventions/Progress Updates:    Upon entering the room, pt seated in recliner chair with 5/10 c/o pain in L LE but received pain medication prior to OT arrival. Pt performed sit >stand from recliner chair with RW with supervision and ambulated 10' to wheelchair with close supervision. Pt managed wheelchair parts and seated at sink for grooming tasks at mod I level. Pt engaged in community mobility activity from wheelchair level. Pt propelled wheelchair onto elevator, crowded hallways, gift shops aisles, and outside onto various surfaces and slopes with supervision overall. Min verbal cues needed for safety with speed. Pt taking rest breaks as needed throughout session. Pt propelled self back to room at end of session and managed wheelchair leg rests with min cues for technique and transferred into bed to rest. Bed alarm activated and call bell within reach.  Therapy Documentation Precautions:  Precautions Precautions: Fall Required Braces or Orthoses: Other Brace/Splint Knee Immobilizer - Left: (Discontinue per ortho 1/7) Other Brace/Splint: bledsoe brace unlocked RLE for OOB; does not need brace in bed Restrictions Weight Bearing Restrictions: Yes RLE Weight Bearing: Weight bearing as tolerated LLE Weight Bearing: Non weight bearing General:   Vital Signs:   Pain: Pain Assessment Pain Score: 5  Pain Type: Acute pain Pain Location: Leg Pain Orientation: Left Pain Onset: With Activity Pain Intervention(s): Elevated extremity;Rest  See Function Navigator for Current Functional Status.   Therapy/Group: Individual Therapy  [name], Kelsee Preslar P 08/03/2017, 12:30 PM

## 2017-08-03 NOTE — Progress Notes (Addendum)
Physical Therapy Session Note  Patient Details  Name: Erik DickerMichael A Aguilar MRN: [id number] Date of Birth: 01/09/1976  Today's Date: 08/03/2017 PT Individual Time: [id number], 1500-1600 PT Individual Time Calculation (min): 75 min, 60 min  Short Term Goals: Week 1:  PT Short Term Goal 1 (Week 1): STGs = LTGs  Skilled Therapeutic Interventions/Progress Updates:    Session1: Patient in recliner in room reports has 5 steps to enter apt with bilateral rails too wide to reach both and couple of steps up to entrance with steps not sure if there are railings.  Patient stand pivot to w/c with RW S cues for hand placement. Patient propelled w/c S to therapy gym.  Practiced 4 stairs with rail and crutch after demo with min A, then with two crutches no rail min to mod A due to one episode of loss of balance with crutch malplaced.  Patient propelled to ADL apartment and practiced gait over carpet, transfer from couch and in kitchen to retrieve item from cabinet and fridge mantaining NWB to TDWB L LE.  Patient assisted to adjust Bledsoe brace for fit due to sliding down on leg.  Discussed sleeping on couch at ex-fiance's home and not getting w/c for home, but using those at MD offices for doctor visits.  Patient stand pivot to mat without walker unsafe technique due to not moving L legrest out of the way, educated in importance of taking time to set up for safety.  Patient on mat for LE therex consisting of single leg bridge on L, hip abduction, SLR, SAQ, heel slides.  Supine <>sit with S.  Patient ambulated 60' NWB on L with RW and S. Assist to recliner in room with mod cues for safe w/c set up and using RW to keep weight bearing precatuions (TDWB per ortho trauma progress note, PA made aware as NWB order in chairt).  Left in recliner all needs in reach and ice on L knee.   Session2:  Patient c/o burning pain in R knee more than before, though ice helped this am while it was on.  Patient supine to sit S and transferred to  w/c via squat pivot with S after w/c set up.  Propelled to therapy gym 150' with S.  Negotiated 4 steps with rail and crutch x 2 min A and cues for sequencing.  Patient reports ex-fiance's apartment with one rail on L on initial steps, then two rails too wide to reach on entry to apartment.  Patient ambulated with crutches and minguard to S x 60' with two turns.  Educated to keep NWB if performing TDWB causing increased pain.  Worked on standing endurance standing 3', 2' and 3' to play Jenga with one UE support maintaining weightbearing.  Propelled to ortho gym and performed car transfer to height simulating ex-fiance's vehicle ([employer]) with mod cues and close S.  Discussed plan for d/c and home needs as well as plans for mod I in room prior to d/c.  In room pt ambulated into bathroom and out with min A due to unsafe speed and cues for slowing for safety.  Patient left in supine and educated on HEP and issued handout, encouraged to perform on his own.     Therapy Documentation Precautions:  Precautions Precautions: Fall Required Braces or Orthoses: Other Brace/Splint Knee Immobilizer - Left: (Discontinue per ortho 1/7) Other Brace/Splint: bledsoe brace unlocked RLE for OOB; does not need brace in bed Restrictions Weight Bearing Restrictions: Yes RLE Weight Bearing: Weight bearing  as tolerated(Bledsoe Brace) LLE Weight Bearing: Non weight bearing Pain: Pain Assessment Pain Assessment: 0-10 Pain Score: 5  Pain Type: Acute pain Pain Location: Leg Pain Orientation: Left Pain Descriptors / Indicators: Aching   See Function Navigator for Current Functional Status.   Therapy/Group: Individual Therapy  Elray Mcgregor 08/03/2017, 2:15 PM

## 2017-08-03 NOTE — Patient Instructions (Signed)
Ankle Pump    Bend ankles up and down, alternating feet. Repeat __20__ times. Do _2___ sessions per day.  http://gt2.exer.us/290   Copyright  VHI. All rights reserved.  Heel Slide    Bend left knee and pull heel toward buttocks. Repeat __5-10__ times. Do __2__ sessions per day.  http://gt2.exer.us/300   Copyright  VHI. All rights reserved.  Straight Leg Raise    Bend right leg. Keep other leg as straight as possible and tighten muscles on top of thigh. Slowly lift straight leg __6__ inches from bed and hold __3__ seconds. Lower it, keeping muscles tight __3__ seconds. Relax. Repeat __10__ times. Do _2___ sessions per day.  http://[email or web address].exer.us/296   Copyright  VHI. All rights reserved.  Bridging (Single Leg)    Lie on back with feet shoulder width apart and left leg straight. Lift hips toward the ceiling while keeping leg straight. Hold _5___ seconds. Repeat __10__ times. Do __2__ sessions per day.  http://gt2.exer.us/358   Copyright  VHI. All rights reserved.  Abduction    Slide one leg out to side. Keep kneecap pointing up. Gently bring leg back to pillow. Repeat with other leg. Repeat __10__ times. Do __2__ sessions per day.  http://gt2.exer.us/373   Copyright  VHI. All rights reserved.

## 2017-08-03 NOTE — Significant Event (Signed)
Patient refuses all his HS med except for Tylenol and Oxy PRN
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
## 2017-08-04 ENCOUNTER — Inpatient Hospital Stay (HOSPITAL_COMMUNITY): Payer: BLUE CROSS/BLUE SHIELD | Admitting: Occupational Therapy

## 2017-08-04 ENCOUNTER — Inpatient Hospital Stay (HOSPITAL_COMMUNITY): Payer: BC Managed Care – PPO

## 2017-08-04 ENCOUNTER — Encounter (HOSPITAL_COMMUNITY): Payer: BC Managed Care – PPO | Admitting: Psychology

## 2017-08-04 ENCOUNTER — Inpatient Hospital Stay (HOSPITAL_COMMUNITY): Payer: BLUE CROSS/BLUE SHIELD | Admitting: Physical Therapy

## 2017-08-04 DIAGNOSIS — S065X1D Traumatic subdural hemorrhage with loss of consciousness of 30 minutes or less, subsequent encounter: Principal | ICD-10-CM

## 2017-08-04 DIAGNOSIS — R55 Syncope and collapse: Secondary | ICD-10-CM
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
## 2017-08-04 NOTE — Progress Notes (Signed)
Patient continue to refuse all HS med except for Tylenol, prn Oxy and Robaxin. Patient educated for the second times but still refuses his meds. We continue to monitor.
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
## 2017-08-04 NOTE — Progress Notes (Signed)
Occupational Therapy Session Note  Patient Details  Name: Erik Aguilar MRN: [id number] Date of Birth: 01/24/1976  Today's Date: 08/04/2017 OT Individual Time: 1415-1510 OT Individual Time Calculation (min): 55 min    Short Term Goals: Week 1:  OT Short Term Goal 1 (Week 1): STG= LTG  Skilled Therapeutic Interventions/Progress Updates:    Upon entering the room, pt seated on EOB and agreeable to shower. Pt ambulating with RW at supervision level and taking all needed items into bathroom with him. Transfer onto TTB at mod I level. Pt performed lateral leans to wash buttocks and peri area. Pt transferred onto toilet for dressing. Pt able to don bledsoe brace independently this session with increased time. Pt did require assistance to don L sock and declined sock aide this session. OT noticed object imbedded in pt's forehead and notified PA who arrived to remove foreign body object.  20 minutes missed time. All needs within reach.   Therapy Documentation Precautions:  Precautions Precautions: Fall Required Braces or Orthoses: Other Brace/Splint Knee Immobilizer - Left: (Discontinue per ortho 1/7) Other Brace/Splint: bledsoe brace unlocked RLE for OOB; does not need brace in bed Restrictions Weight Bearing Restrictions: Yes RLE Weight Bearing: Weight bearing as tolerated LLE Weight Bearing: Touchdown weight bearing General: General OT Amount of Missed Time: 20 Minutes Vital Signs: Therapy Vitals Temp: 98.6 F (37 C) Temp Source: Oral Pulse Rate: 92 Resp: 16 BP: 109/60 Patient Position (if appropriate): Lying Oxygen Therapy SpO2: 96 % O2 Device: Not Delivered Pain: Pain Assessment Pain Assessment: 0-10 Pain Score: 10-Worst pain ever Pain Type: Acute pain Pain Location: Leg Pain Orientation: Left Pain Descriptors / Indicators: Aching Pain Frequency: Constant Pain Onset: On-going Patients Stated Pain Goal: 4 Pain Intervention(s): Medication (See eMAR) ADL:   Vision    Perception    Praxis   Exercises:   Other Treatments:    See Function Navigator for Current Functional Status.   Therapy/Group: Individual Therapy  [name], [name] 08/04/2017, 5:10 PM

## 2017-08-04 NOTE — Progress Notes (Signed)
Physical Therapy Session Note  Patient Details  Name: Erik Aguilar MRN: [name] Date of Birth: 09/24/1975  Today's Date: 08/04/2017 PT Individual Time: 1310-1404 PT Individual Time Calculation (min): 54 min   Short Term Goals: Week 1:  PT Short Term Goal 1 (Week 1): STGs = LTGs  Skilled Therapeutic Interventions/Progress Updates:  Pt received in bed & agreeable to tx. Pt transferred to EOB & donned RLE bledsoe brace with set up assist and extra time. During session pt reported decreased endurance and therapist educated him on energy conservation and need for safety as primary focus. Pt propelled w/c throughout unit with BUE & mod I. In gym pt negotiated 4 steps x 2 trials with R rail and L crutch; pt requires education regarding technique and sequence. Therapist provided pt with written instructions for stair negotiation as he reported his assistance upon d/c will not be able to come in for hands on training prior to d/c. Pt requires min assist for stair negotiation 2/2 impaired balance. Pt completed car transfer at very low SUV simulated height with RW & supervision. Pt utilized BUE ergometer for 5 minutes forwards + 5 minutes backwards for cardiovascular endurance training. At end of session pt left in w/c with father present in room.  Discussed DME recommendations with therapist recommending 20x18 w/c with ELR for community mobility, RW for household mobility, crutch for stair negotiation and pt reporting he will need a chair for the walk-in shower. Also recommending HHPT for further endurance and gait training and overall safety awareness.  Throughout session pt able to maintain TDWB LLE without very minimal cuing from therapist and no physical assistance.  Therapy Documentation Precautions:  Precautions Precautions: Fall Required Braces or Orthoses: Other Brace/Splint Knee Immobilizer - Left: (Discontinue per ortho 1/7) Other Brace/Splint: bledsoe brace unlocked RLE for OOB; does not  need brace in bed Restrictions Weight Bearing Restrictions: Yes RLE Weight Bearing: Weight bearing as tolerated LLE Weight Bearing: Touchdown weight bearing  Pain: Pt reported LLE pain but declined pain meds at this time.   See Function Navigator for Current Functional Status.   Therapy/Group: Individual Therapy  Sandi MariscalVictoria M Guida Asman 08/04/2017, 3:16 PM

## 2017-08-04 NOTE — Significant Event (Signed)
Patient refused AM medications except scheduled Robaxin, Lovenox, and Methadone. Patient educated on importance of medications however, he still refused. MD aware of patient's refusal of medication.

## 2017-08-04 NOTE — Progress Notes (Signed)
Physical Therapy Note  Patient Details  Name: Erik DickerMichael A Aguilar MRN: [id number] Date of Birth: 07/25/1976 Today's Date: 08/04/2017  [id number], 55 min individual tx Pain: 6/10, R knee ; premedicated  Pt supine, c/o being very sore from practicing stairs yesterday.  Supine exs: 2 x 10 bil lower trunk rotation, with PT holding weight of RLE, ankle pumps bil; 1 x 10 R/L straight leg raises, abdominal crunches, bil hip internal rotation, glut sets  Bed mobility to sit EOB and eat breakfast.  Pt donned R Bledsoe brace with assistance to get straps attached corrctly prior to donning.  Stand pivot transfer, declining use of RW, but pulling up on sink .  PT discussed safety concerns about pulling on items attached to walls  W/c propulsion modified independent using bil UEs.  Pt declined ambulation on level tile.  Up/down (4) 6" high steps, L crutch, R rail, ascending R, descending L.  Pt had poor clearance R foot on 3rd step.  Pt very frustrated with PT and yellled to take "me back to my room" when PT asked pt to slow down and state his sequence.  Pt propelled back to room, and PT applied ice pack to L knee; pt declined putting ice on R knee at this time.   See function navigator for current status.   Mimie Goering 08/04/2017, 7:55 AM

## 2017-08-05 ENCOUNTER — Inpatient Hospital Stay (HOSPITAL_COMMUNITY): Payer: BC Managed Care – PPO | Admitting: Physical Therapy

## 2017-08-05 ENCOUNTER — Inpatient Hospital Stay (HOSPITAL_COMMUNITY): Payer: BC Managed Care – PPO | Admitting: Occupational Therapy

## 2017-08-05 ENCOUNTER — Inpatient Hospital Stay (HOSPITAL_COMMUNITY): Payer: BLUE CROSS/BLUE SHIELD | Admitting: Occupational Therapy

## 2017-08-05 ENCOUNTER — Inpatient Hospital Stay (HOSPITAL_COMMUNITY): Payer: BLUE CROSS/BLUE SHIELD | Admitting: Physical Therapy

## 2017-08-05 MED ORDER — TRAMADOL HCL 50 MG PO TABS
50.0000 mg | ORAL_TABLET | Freq: Four times a day (QID) | ORAL | Status: DC
  Filled 2017-08-05: qty 1

## 2017-08-05 NOTE — Progress Notes (Signed)
Physical Therapy Session Note  Patient Details  Name: Erik Aguilar MRN: [id number] Date of Birth: 1976/07/02  Today's Date: 08/05/2017 PT Individual Time: 1021-1051 PT Individual Time Calculation (min): 30 min   Short Term Goals: Week 1:  PT Short Term Goal 1 (Week 1): STGs = LTGs  Skilled Therapeutic Interventions/Progress Updates:   Pt received sitting in WC and agreeable to PT  WC mobility throughout rehab unit without cues or assist [id number] and [id number] Additional WC mobility through hospital gift shop x [id number] supervision assist for cues for navigation around tight spaces.   Gait in hospital gift shop x [id number] supervision assist with RW. Pt able to maintain WB status.   Stand pivot and sit<>stand Transfers all completed with supervision assist and RW.   Patient returned to room and [facility] reach and all needs met.        Therapy Documentation Precautions:  Precautions Precautions: Fall Required Braces or Orthoses: Other Brace/Splint Knee Immobilizer - Left: (Discontinue per ortho 1/7) Other Brace/Splint: bledsoe brace unlocked RLE for OOB; does not need brace in bed Restrictions Weight Bearing Restrictions: Yes RLE Weight Bearing: Weight bearing as tolerated LLE Weight Bearing: Touchdown weight bearing Pain: 7/10 soreness. BLE  See Function Navigator for Current Functional Status.   Therapy/Group: Individual Therapy  AustLorie Phenix0/2019, 10:54 AM

## 2017-08-05 NOTE — Progress Notes (Signed)
Occupational Therapy Session Note  Patient Details  Name: Verl DickerMichael A Cobbins MRN: [id number] Date of Birth: 06/10/1976  Today's Date: 08/05/2017 OT Individual Time: [id number] OT Individual Time Calculation (min): 60 min    Short Term Goals: Week 1:  OT Short Term Goal 1 (Week 1): STG= LTG  Skilled Therapeutic Interventions/Progress Updates:    Pt presents supine in bed agreeable to OT tx session, reports pain in LLE during session, managed with rest breaks and repositioning. Pt completes squat pivot transfer EOB>w/c with S. Completes grooming ADLs from w/c level with Mod independence. Pt propels w/c throughout unit during session with ModI. Pt engages in dynamic standing activity in ADL kitchen at RW level reaching and obtaining items from varying heights with S throughout, Pt maintaining WB status and initiating rest break PRN. Discussion held including review from previous sessions and further education provided on safety during simple meal prep tasks and mobility within the kitchen with Pt verbalizing understanding. Pt engages in standing activity in therapy gym for increased standing tolerance/endurance, engaging in game of checkers and standing approx 5-6 min prior to needing seated rest break. Pt completes arm bike for 10 min alternating between propelling forward/backward for increased UB strength/endurance. Pt returns to room in manner described above, ambulates within room at RW level with S to transfer to recliner where Pt was left with call bell and needs within reach.   Therapy Documentation Precautions:  Precautions Precautions: Fall Required Braces or Orthoses: Other Brace/Splint Knee Immobilizer - Left: (Discontinue per ortho 1/7) Other Brace/Splint: bledsoe brace unlocked RLE for OOB; does not need brace in bed Restrictions Weight Bearing Restrictions: Yes RLE Weight Bearing: Weight bearing as tolerated LLE Weight Bearing: Touchdown weight bearing      See Function Navigator  for Current Functional Status.   Therapy/Group: Individual Therapy  Orlando PennerBreanna L Barkley Kratochvil 08/05/2017, 12:18 PM

## 2017-08-05 NOTE — Care Management Note (Signed)
[facility] Individual Statement of Services  Patient Name:  Verl DickerMichael A Lapoint  Date:  08/02/2017  Welcome [facility] the [facility].  Our goal is to provide you with an individualized program based on your diagnosis and situation, designed to meet your specific needs.  With this comprehensive rehabilitation program, you will be expected to participate in at least 3 hours of rehabilitation therapies Monday-Friday, with modified therapy programming on the weekends.  Your rehabilitation program will include the following services:  Physical Therapy (PT), Occupational Therapy (OT), Speech Therapy (ST), 24 hour per day rehabilitation nursing, Therapeutic Recreaction (TR), Neuropsychology, Case Management (Social Worker), Rehabilitation Medicine, Nutrition Services and Pharmacy Services  Weekly team conferences will be held on Tuesdays to discuss your progress.  Your Social Worker will talk with you frequently to get your input and to update you on team discussions.  Team conferences with you and your family in attendance may also be held.  Expected length of stay: 7-10 days    Overall anticipated outcome: modified independent  Depending on your progress and recovery, your program may change. Your Social Worker will coordinate services and will keep you informed of any changes. Your Social Worker's name and contact numbers are listed  below.  The following services may also be recommended but are not provided by the [facility]:   Driving Evaluations  Home Health Rehabiltiation Services  Outpatient Rehabilitation Services  Vocational Rehabilitation   Arrangements will be made to provide these services after discharge if needed.  Arrangements include referral to agencies that provide these services.  Your insurance has been verified to be:  [employer] Your primary doctor is:  AnimatorBaity  Pertinent information will be shared with your doctor and your  insurance company.  Social Worker:  [place] De[place]ion [place], [place] [phone] or [phone]) [phone]   Information discussed with and copy given to patient by: Amada JupiterHOYLE, Damaria Stofko, 08/02/2017, 1:51 PM

## 2017-08-05 NOTE — Progress Notes (Signed)
Occupational Therapy Discharge Summary  Patient Details  Name: Erik Aguilar MRN: [id number] Date of Birth: 05/11/1976  Today's Date: 08/05/2017 OT Individual Time: 1301-1356 OT Individual Time Calculation (min): 55 min    Patient has met 15 of 15 long term goals due to improved activity tolerance, improved balance, ability to compensate for deficits, improved awareness and improved coordination.  Patient to discharge at overall Modified Independent level.    Reasons goals not met: all goals met  Recommendation:  Patient will benefit from ongoing skilled OT services in home health setting to continue to advance functional skills in the area of BADL.  Equipment: TTB  Reasons for discharge: treatment goals met  Patient/family agrees with progress made and goals achieved: Yes   OT Intervention: Upon entering the room, pt supine in bed sleeping with 9/10 c/o pain in L LE. Pt declined OOB tasks this session as well as self care tasks. OT educated pt on energy conservation, B UE HEP with level 3 theraband, and safety in the community. Pt reclined to return demonstrations for exercises. OT leaving paper handout with exercises in patients room. Pt remained in bed at end of session with all needs within reach. Pt made mod I in room.   OT Discharge Precautions/Restrictions  Precautions Precautions: Fall Required Braces or Orthoses: Other Brace/Splint Other Brace/Splint: bledsoe brace RLE, unlocked during day Restrictions Weight Bearing Restrictions: Yes RLE Weight Bearing: Weight bearing as tolerated LLE Weight Bearing: Non weight bearing General   Vital Signs Therapy Vitals Temp: 98.3 F (36.8 C) Temp Source: Oral Pulse Rate: 98 Resp: 18 BP: 120/72 Patient Position (if appropriate): Sitting Oxygen Therapy SpO2: 98 % O2 Device: Not Delivered Pain Pain Assessment Pain Assessment: 0-10 Pain Score: 9  Pain Location: Leg Pain Orientation: Left Pain Descriptors / Indicators:  Aching Pain Onset: On-going Patients Stated Pain Goal: 3 Pain Intervention(s): RN made aware;Repositioned Multiple Pain Sites: No Vision Baseline Vision/History: Wears glasses Wears Glasses: At all times Patient Visual Report: No change from baseline Vision Assessment?: No apparent visual deficits Additional Comments: pt reports no changes in baseline vision Cognition Overall Cognitive Status: Impaired/Different from baseline Arousal/Alertness: Awake/alert Orientation Level: Oriented X4 Memory: (short term memory impaired) Safety/Judgment: Appears intact Sensation Sensation Light Touch: Appears Intact Proprioception: Appears Intact Coordination Gross Motor Movements are Fluid and Coordinated: Yes Motor  Motor Motor: Within Functional Limits Motor - Discharge Observations: poor endurance Mobility  Bed Mobility Bed Mobility: Supine to Sit;Sit to Supine Left Sidelying to Sit: 6: Modified independent (Device/Increase time) Supine to Sit: 6: Modified independent (Device/Increase time) Sit to Supine: 6: Modified independent (Device/Increase time) Transfers Sit to Stand: 6: Modified independent (Device/Increase time) Stand to Sit: 6: Modified independent (Device/Increase time);With armrests(with RW)  Trunk/Postural Assessment  Cervical Assessment Cervical Assessment: Within Functional Limits Thoracic Assessment Thoracic Assessment: Within Functional Limits Lumbar Assessment Lumbar Assessment: Within Functional Limits Postural Control Postural Control: Deficits on evaluation  Balance Balance Balance Assessed: Yes Dynamic Standing Balance Dynamic Standing - Balance Support: Bilateral upper extremity supported(with RW) Dynamic Standing - Level of Assistance: 6: Modified independent (Device/Increase time) Dynamic Standing - Balance Activities: (during gait) Extremity/Trunk Assessment RUE Assessment RUE Assessment: Within Functional Limits LUE Assessment LUE Assessment:  Within Functional Limits   See Function Navigator for Current Functional Status.  ,  P 08/05/2017, 4:14 PM  
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
## 2017-08-05 NOTE — Patient Care Conference (Signed)
Inpatient RehabilitationTeam Conference and Plan of Care Update Date: 08/03/2017   Time: 2:45 PM    Patient Name: Erik Aguilar      Medical Record Number: [id number]  Date of Birth: 09/16/1975 Sex: Male         Room/Bed: 4W05C/4W05C-01 Payor Info: Payor: BLUE CROSS BLUE SHIELD / Plan: BCBS OTHER / Product Type: *No Product type* /    Admitting Diagnosis: TBI Polytrauma  Admit Date/Time:  07/30/2017  3:28 PM Admission Comments: No comment available   Primary Diagnosis:  <principal problem not specified> Principal Problem: <principal problem not specified>  Patient Active Problem List   Diagnosis Date Noted  . Hypotension   . Syncope 07/30/2017  . Narcotic addiction (HCC) 07/30/2017  . HTN (hypertension) 07/30/2017  . Traumatic subdural hematoma (HCC) 07/30/2017  . Acute blood loss anemia   . Post-operative pain   . Tobacco abuse   . Generalized anxiety disorder   . Hyponatremia   . Closed bicondylar fracture of left tibial plateau 07/27/2017  . Fracture of right proximal fibula 07/27/2017  . 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 SDH (subdural hematoma) (HCC) 07/23/2017  . Food poisoning 12/18/2015  . Anxiety state 10/15/2015  . Environmental allergies 10/15/2015  . Chronic back pain 10/15/2015  . Family history of prostate cancer 10/15/2015    Expected Discharge Date: Expected Discharge Date: 08/06/17  Team Members Present: Physician leading conference: Dr. Faith Rogue Social Worker Present: Amada Jupiter, LCSW Nurse Present: Adora Fridge, RN PT Present: Aleda Grana, PT OT Present: Callie Fielding, OT SLP Present: Feliberto Gottron, SLP PPS Coordinator present : Tora Duck, RN, CRRN     Current Status/Progress Goal Weekly Team Focus  Medical   Traumatic brain injury and left tibial plateau fracture.  Patient with history of substance abuse.  Experienced some withdrawal-like symptoms after being off his Suboxone  Manage mood and pain related issues as above  Pain and  withdrawal symptom management, brain injury education   Bowel/Bladder   Continent of bowel/bladder. Using BSC. LBM 08/01/2016  continue to be continent with min assist   Assess bowel/bladder function q shift and as needed   Swallow/Nutrition/ Hydration             ADL's   min A - S overall  mod I overall  d/c planning, endurance, self care, balance, edu   Mobility   min A to S  mod I, except stairs min A  stairs, safety, endurance with gait   Communication             Safety/Cognition/ Behavioral Observations            Pain   Conplained of leg pain, Tylenol given. Refuses his Tramadol, and PRN oxy.  <2  Assess and treat pain q shift and as needed   Skin   Surgical incision, multiple abrassion  Surgical wound to be free of infection with min assist  Assess incision site for redness, drainage, pain and address any changes q shift and as needed    Rehab Goals Patient on target to meet rehab goals: Yes *See Care Plan and progress notes for long and short-term goals.     Barriers to Discharge  Current Status/Progress Possible Resolutions Date Resolved   Physician    Medical stability        Management as above      Nursing                  PT  Decreased  caregiver support                 OT                  SLP                SW                Discharge Planning/Teaching Needs:  Pt to d/c to home of ex-fiance who does work f/t days but can provide intermittent support.  NA -pt with mod ind goals   Team Discussion:  Pt making good progress toward mod ind goals.  MD has discussed methadone dosing and precautions with him. Should be ready for d/c end of week.  Revisions to Treatment Plan:  None    Continued Need for Acute Rehabilitation Level of Care: The patient requires daily medical management by a physician with specialized training in physical medicine and rehabilitation for the following conditions: Daily direction of a multidisciplinary physical rehabilitation  program to ensure safe treatment while eliciting the highest outcome that is of practical value to the patient.: Yes Daily medical management of patient stability for increased activity during participation in an intensive rehabilitation regime.: Yes Daily analysis of laboratory values and/or radiology reports with any subsequent need for medication adjustment of medical intervention for : Neurological problems;Mood/behavior problems  Tijuana Scheidegger 08/05/2017, 2:13 PM

## 2017-08-05 NOTE — Progress Notes (Signed)
Social Work  Discharge Note  The overall goal for the admission was met for:   Discharge location: Yes - home with ex-fiance who can provide intermittent assistance  Length of Stay: Yes - 7 days  Discharge activity level: Yes- modified independent  Home/community participation: Yes  Services provided included: MD, RD, PT, OT, SLP, RN, TR, Pharmacy, [facility]: Private Insurance: [place]  Follow-up services arranged: Home Health: PT via [facility], DME: 20x18 lightweight w/c with ELRs, cushion, rolling walker, tub seat via [facility] and Patient/Family has no preference for HH/DME agencies  Comments (or additional information):  Patient/Family verbalized understanding of follow-up arrangements: Yes  Individual responsible for coordination of the follow-up plan: pt  Confirmed correct DME delivered: Erik Aguilar 08/05/2017    Erik Aguilar
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
## 2017-08-05 NOTE — Progress Notes (Signed)
Social Work Patient ID: Verl DickerMichael A Bohnet, male   DOB: 05/08/1976, 42 y.o.   MRN: [id number]   Have reviewed team conference information with patient who is aware and agreeable with discharge planned for tomorrow at modified independent level.  I have made referral for home health physical therapy as patient does not have transportation for outpatient.  DME has been ordered.  Ready for DC.

## 2017-08-05 NOTE — Progress Notes (Signed)
Physical Therapy Discharge Summary  Patient Details  Name: Erik Aguilar MRN: [id number] Date of Birth: 1975-07-28  Today's Date: 08/05/2017 PT Individual Time: 0762-2633 PT Individual Time Calculation (min): 62 min    Patient has met 9 of 9 long term goals due to improved activity tolerance, improved balance, improved postural control, increased strength, ability to compensate for deficits, functional use of  right upper extremity, right lower extremity, left upper extremity and left lower extremity, improved awareness and improved coordination.  Patient to discharge at community w/c and household ambulatory level  level Modified Independent.   No caregivers were present for family education. Patient was provided with written instructions regarding sequence for stair negotiation to provide to caregiver.  Reasons goals not met: n/a  Recommendation:  Patient will benefit from ongoing skilled PT services in home health setting to continue to advance safe functional mobility, address ongoing impairments in decreased endurance, gait training, cognitive remediation, and to increase independence with stair negotiation, and minimize fall risk.  Equipment: 20x18 w/c with ELR, cruthces, RW, tub bench  Reasons for discharge: treatment goals met   Patient/family agrees with progress made and goals achieved: Yes   Skilled PT Treatment: Patient received in bed & agreeable to tx. Pt participated in grad day activities, performing transfers (sit<>stand, squat pivot, and car at SUV simulated height), gait with RW over even surface, and w/c mobility with mod I. Pt did require min assist for gait over uneven surface with RW and stair negotiation with R rail and L crutch. Pt reports no questions or concerns regarding d/c home. Pt utilized BUE ergometer on level 2 x 6 minutes forwards + 6 minutes backwards for cardiovascular endurance training. At end of session pt returned to bed & was left with all needs  within reach.  No caregiver present for education or hands-on training.   PT Discharge Precautions/Restrictions Precautions Required Braces or Orthoses: Other Brace/Splint Other Brace/Splint: bledsoe brace RLE, unlocked during day Restrictions RLE Weight Bearing: Weight bearing as tolerated LLE Weight Bearing: Touchdown weight bearing  Pain Pt reports 7-8/10 pain in LLE but reports being premedicated & RN administered muscle relaxer meds during session.  Vision/Perception  Vision - Assessment Additional Comments: pt reports no changes in baseline vision, wears glasses at all times   Cognition Overall Cognitive Status: Impaired/Different from baseline(impaired short term memory) Arousal/Alertness: Awake/alert Orientation Level: Oriented X4 Memory: (short term memory impaired) Safety/Judgment: Appears intact  Sensation Sensation Light Touch: Appears Intact(denies numbness & tingling) Coordination Gross Motor Movements are Fluid and Coordinated: Yes  Motor  Motor Motor - Discharge Observations: poor endurance   Mobility Bed Mobility Bed Mobility: Supine to Sit;Sit to Supine(regular bed) Supine to Sit: 6: Modified independent (Device/Increase time) Sit to Supine: 6: Modified independent (Device/Increase time) Transfers Transfers: Yes Sit to Stand: 6: Modified independent (Device/Increase time);With armrests(with RW) Stand to Sit: 6: Modified independent (Device/Increase time);With armrests(with RW) Squat Pivot Transfers: 6: Modified independent (Device/Increase time);With armrests  Locomotion  Ambulation Ambulation: Yes Ambulation/Gait Assistance: 6: Modified independent (Device/Increase time) Ambulation Distance (Feet): 70 Feet Assistive device: Rolling walker Gait Gait: Yes(decreased gait speed, TDWB LLE) Stairs / Additional Locomotion Stairs: Yes Stairs Assistance: 4: Min assist Stairs Assistance Details: Verbal cues for sequencing Stairs Assistance Details  (indicate cue type and reason): provided pt with written instructions regarding sequence Stair Management Technique: One rail Right(with Left crutch) Number of Stairs: 4 Height of Stairs: 6(inches) Wheelchair Mobility Wheelchair Mobility: Yes Wheelchair Assistance: 6: Modified independent (Device/Increase time) Environmental health practitioner:  Both upper extremities Wheelchair Parts Management: Independent Distance: >150 ft    Balance Balance Balance Assessed: Yes Dynamic Standing Balance Dynamic Standing - Balance Support: Bilateral upper extremity supported(with RW) Dynamic Standing - Level of Assistance: 6: Modified independent (Device/Increase time) Dynamic Standing - Balance Activities: (during gait)  Extremity Assessment  RUE Assessment RUE Assessment: Within Functional Limits LUE Assessment LUE Assessment: Within Functional Limits RLE Assessment RLE Assessment: Within Functional Limits(bledsoe brace unlocked during day) LLE Assessment LLE Assessment: (TDWB) LLE AROM (degrees) Overall AROM Left Lower Extremity: Within functional limits for tasks assessed   See Function Navigator for Current Functional Status.   M  08/05/2017, 3:23 PM   

## 2017-08-05 NOTE — Discharge Summary (Signed)
Discharge summary job [id number]

## 2017-08-06 DIAGNOSIS — S065X1S Traumatic subdural hemorrhage with loss of consciousness of 30 minutes or less, sequela: Secondary | ICD-10-CM

## 2017-08-06 MED ORDER — OXYCODONE HCL 5 MG PO TABS: 10.0000 mg | ORAL_TABLET | Freq: Four times a day (QID) | ORAL | Status: DC | PRN

## 2017-08-06 MED ORDER — METHADONE HCL 10 MG PO TABS: 10.0000 mg | ORAL_TABLET | Freq: Two times a day (BID) | ORAL | 0 refills | Status: DC

## 2017-08-06 MED ORDER — OXYCODONE HCL 15 MG PO TABS: 15.0000 mg | ORAL_TABLET | ORAL | 0 refills | Status: DC | PRN

## 2017-08-06 MED ORDER — DOCUSATE SODIUM 100 MG PO CAPS: 100.0000 mg | ORAL_CAPSULE | Freq: Two times a day (BID) | ORAL | 0 refills | Status: DC

## 2017-08-06 MED ORDER — PANTOPRAZOLE SODIUM 40 MG PO TBEC: 40.0000 mg | DELAYED_RELEASE_TABLET | Freq: Every day | ORAL | 0 refills | Status: DC

## 2017-08-06 MED ORDER — OXYCODONE HCL 5 MG PO TABS: 15.0000 mg | ORAL_TABLET | Freq: Four times a day (QID) | ORAL | Status: DC | PRN

## 2017-08-06 MED ORDER — TRAMADOL HCL 50 MG PO TABS: 50.0000 mg | ORAL_TABLET | Freq: Four times a day (QID) | ORAL | 0 refills | Status: DC

## 2017-08-06 MED ORDER — ALPRAZOLAM 0.5 MG PO TABS: 0.5000 mg | ORAL_TABLET | Freq: Two times a day (BID) | ORAL | 0 refills | Status: DC | PRN

## 2017-08-06 MED ORDER — METHOCARBAMOL 500 MG PO TABS: 500.0000 mg | ORAL_TABLET | Freq: Three times a day (TID) | ORAL | 0 refills | Status: DC

## 2017-08-06 MED FILL — METHADONE HCL 10 MG TABLET: 10 | 30 days supply | Qty: 60 | Fill #0

## 2017-08-06 MED FILL — oxyCODONE HCL 15 MG TABS: 15 | 5 days supply | Qty: 30 | Fill #0

## 2017-08-06 MED FILL — METHOCARBAMOL 500 MG TABS: 500 | 20 days supply | Qty: 60 | Fill #0

## 2017-08-06 MED FILL — traMADol HCL 50 MG TABS: 50 | 15 days supply | Qty: 60 | Fill #0

## 2017-08-06 MED FILL — ALPRAZolam 0.5 MG TABS: 0.5 | 15 days supply | Qty: 30 | Fill #0

## 2017-08-06 MED FILL — PANTOPRAZOLE SOD DR 40 MG T: 40 | 30 days supply | Qty: 30 | Fill #0

## 2017-08-06 NOTE — Discharge Instructions (Signed)
Inpatient Rehab Discharge Instructions  Verl DickerMichael A Holzman Discharge date and time: No discharge date for patient encounter.   Activities/Precautions/ Functional Status: Activity: Touchdown weightbearing left lower extremity. Weightbearing as tolerated right lower extremity with Bledsoe brace Diet: regular diet Wound Care: keep wound clean and dry Functional status:  ___ No restrictions     ___ Walk up steps independently ___ 24/7 supervision/assistance   ___ Walk up steps with assistance ___ Intermittent supervision/assistance  ___ Bathe/dress independently ___ Walk with walker     _x__ Bathe/dress with assistance ___ Walk Independently    ___ Shower independently ___ Walk with assistance    ___ Shower with assistance ___ No alcohol     ___ Return to work/school ________    COMMUNITY REFERRALS UPON DISCHARGE:    Home Health:   PT                       Agency:  [facility] Phone: [phone]   Medical Equipment/Items Ordered:  Wheelchair, cushion, walker, tub seat                                                       Agency/Supplier:  [facility] @ [phone]         Special Instructions: No driving or smoking or alcohol use  Follow-up with detox center as prior to admission   My questions have been answered and I understand these instructions. I will adhere to these goals and the provided educational materials after my discharge from the hospital.  Patient/Caregiver Signature _______________________________ Date __________  Clinician Signature _______________________________________ Date __________  Please bring this form and your medication list with you to all your follow-up doctor's appointments.
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
## 2017-08-06 NOTE — Progress Notes (Signed)
Orthopedic Tech Progress Note Patient Details:  Erik Aguilar 02/16/1976 [id number]  Ortho Devices Type of Ortho Device: Crutches Ortho Device/Splint Interventions: Application   Post Interventions Patient Tolerated: Well Instructions Provided: Care of device   Erik Aguilar, Erik Aguilar 08/06/2017, 9:19 AM

## 2017-08-06 NOTE — Progress Notes (Signed)
Patient for discharge home accompanied by his dad. AVS given by the Provider. No questions at this time. Waiting for crutches to be delivered in the room

## 2017-08-10 ENCOUNTER — Telehealth: Payer: Self-pay | Admitting: Physical Medicine & Rehabilitation

## 2017-08-10 NOTE — Telephone Encounter (Signed)
I'm sorry. We cannot give him more medication. He has #60 methadone which should last him a month as well. He will need to utilize heat, ice also

## 2017-08-10 NOTE — Telephone Encounter (Signed)
Pt stated he was coming up stairs and the crutch bent, and he fell. Pt stated he felt like it set him back a couple days on his pain. Pt stated he was feeling better until he fell. Pt stated he will not have enough medication left to make it to his hospital F/U appointment. Please advise.

## 2017-08-11 NOTE — Telephone Encounter (Signed)
Contacted patient. I stated Dr. Rosalyn ChartersSwartz's response to his phone call. He states that he had called about his oxycodone script, not the methadone script.  I told him there was an opening on Monday and that the best solution would be to come infor an earlier appointment.   Patient agreed. Appointment rescheduled for 08/16/2017

## 2017-08-12 ENCOUNTER — Inpatient Hospital Stay: Payer: BC Managed Care – PPO | Admitting: Internal Medicine

## 2017-08-12 DIAGNOSIS — Z0289 Encounter for other administrative examinations: Secondary | ICD-10-CM

## 2017-08-16 ENCOUNTER — Encounter
Payer: BLUE CROSS/BLUE SHIELD | Attending: Physical Medicine & Rehabilitation | Admitting: Physical Medicine & Rehabilitation

## 2017-08-16 ENCOUNTER — Other Ambulatory Visit: Payer: Self-pay

## 2017-08-16 ENCOUNTER — Encounter: Payer: Self-pay | Admitting: Physical Medicine & Rehabilitation

## 2017-08-16 VITALS — BP 124/88 | HR 109

## 2017-08-16 DIAGNOSIS — F411 Generalized anxiety disorder: Secondary | ICD-10-CM | POA: Insufficient documentation

## 2017-08-16 DIAGNOSIS — S065X1S Traumatic subdural hemorrhage with loss of consciousness of 30 minutes or less, sequela: Secondary | ICD-10-CM

## 2017-08-16 DIAGNOSIS — S065X0S Traumatic subdural hemorrhage without loss of consciousness, sequela: Secondary | ICD-10-CM | POA: Insufficient documentation

## 2017-08-16 DIAGNOSIS — Z9889 Other specified postprocedural states: Secondary | ICD-10-CM | POA: Insufficient documentation

## 2017-08-16 DIAGNOSIS — Z87891 Personal history of nicotine dependence: Secondary | ICD-10-CM | POA: Insufficient documentation

## 2017-08-16 DIAGNOSIS — X58XXXS Exposure to other specified factors, sequela: Secondary | ICD-10-CM | POA: Insufficient documentation

## 2017-08-16 DIAGNOSIS — K5900 Constipation, unspecified: Secondary | ICD-10-CM | POA: Insufficient documentation

## 2017-08-16 DIAGNOSIS — S82142A Displaced bicondylar fracture of left tibia, initial encounter for closed fracture: Secondary | ICD-10-CM

## 2017-08-16 DIAGNOSIS — F410 Panic disorder [episodic paroxysmal anxiety] without agoraphobia: Secondary | ICD-10-CM | POA: Diagnosis not present

## 2017-08-16 DIAGNOSIS — S82831S Other fracture of upper and lower end of right fibula, sequela: Secondary | ICD-10-CM | POA: Diagnosis not present

## 2017-08-16 MED ORDER — OXYCODONE HCL 10 MG PO TABS: 10.0000 mg | ORAL_TABLET | Freq: Four times a day (QID) | ORAL | 0 refills | Status: DC | PRN

## 2017-08-16 NOTE — Patient Instructions (Addendum)
PLEASE FEEL FREE TO CALL OUR OFFICE WITH ANY PROBLEMS OR QUESTIONS [phone])   Call Dr. Jena GaussHaddix for follow up appointment.

## 2017-08-16 NOTE — Progress Notes (Signed)
Dr. Riley KillSwartz decline a drug screen during todays visit.
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
## 2017-08-18 ENCOUNTER — Telehealth: Payer: Self-pay

## 2017-08-18 ENCOUNTER — Telehealth: Payer: Self-pay | Admitting: Physical Medicine & Rehabilitation

## 2017-08-18 NOTE — Telephone Encounter (Signed)
Pt stated the meds he is currently taking are causing nausea. He wants to know if he can have something for the nausea. Pt would like this sent to the [facility] on [address] in [place]. Please advise.

## 2017-08-18 NOTE — Telephone Encounter (Signed)
Patient called with questions about medications, attempted to call him back, no answer, left voicemail to return call.

## 2017-08-19 MED ORDER — ONDANSETRON HCL 4 MG PO TABS: 4.0000 mg | ORAL_TABLET | Freq: Three times a day (TID) | ORAL | 1 refills | Status: DC | PRN

## 2017-08-19 NOTE — Telephone Encounter (Signed)
zofran 4mg  q8 prn #45 1 rf

## 2017-08-19 NOTE — Telephone Encounter (Signed)
Medication ordered and sent to patients pharmacy, patient notified.

## 2017-08-20 NOTE — Telephone Encounter (Signed)
Second attempt to call patient back in response to his questions, no answer, left message to return call.

## 2017-08-25 ENCOUNTER — Inpatient Hospital Stay: Payer: BC Managed Care – PPO | Admitting: Physical Medicine & Rehabilitation

## 2017-08-27 ENCOUNTER — Telehealth: Payer: Self-pay | Admitting: *Deleted

## 2017-08-27 NOTE — Telephone Encounter (Signed)
Np from [facility] called with questions about methadone and requested to speak with Tressa Busmanan Angiuilli PA. I directed her to PA office or 4th floor [facility] re[facility] where she can speak with Jesusita Okaan or Dr Riley KillSwartz with her questions

## 2017-09-06 ENCOUNTER — Encounter: Payer: BLUE CROSS/BLUE SHIELD | Attending: Physical Medicine & Rehabilitation | Admitting: Registered Nurse

## 2017-09-06 ENCOUNTER — Encounter: Payer: BC Managed Care – PPO | Admitting: Registered Nurse

## 2017-09-06 DIAGNOSIS — F411 Generalized anxiety disorder: Secondary | ICD-10-CM | POA: Insufficient documentation

## 2017-09-06 DIAGNOSIS — S065X0S Traumatic subdural hemorrhage without loss of consciousness, sequela: Secondary | ICD-10-CM | POA: Insufficient documentation

## 2017-09-06 DIAGNOSIS — Z87891 Personal history of nicotine dependence: Secondary | ICD-10-CM | POA: Insufficient documentation

## 2017-09-06 DIAGNOSIS — Z9889 Other specified postprocedural states: Secondary | ICD-10-CM | POA: Insufficient documentation

## 2017-09-06 DIAGNOSIS — K5900 Constipation, unspecified: Secondary | ICD-10-CM | POA: Insufficient documentation

## 2017-09-06 DIAGNOSIS — F410 Panic disorder [episodic paroxysmal anxiety] without agoraphobia: Secondary | ICD-10-CM | POA: Insufficient documentation

## 2017-10-12 ENCOUNTER — Ambulatory Visit: Payer: BLUE CROSS/BLUE SHIELD | Admitting: Family Medicine

## 2017-11-18 ENCOUNTER — Ambulatory Visit (INDEPENDENT_AMBULATORY_CARE_PROVIDER_SITE_OTHER): Payer: BLUE CROSS/BLUE SHIELD | Admitting: Internal Medicine

## 2017-11-18 ENCOUNTER — Encounter: Payer: Self-pay | Admitting: Internal Medicine

## 2017-11-18 VITALS — BP 132/94 | HR 99 | Temp 97.4°F | Ht 67.0 in | Wt 272.0 lb

## 2017-11-18 DIAGNOSIS — I10 Essential (primary) hypertension: Secondary | ICD-10-CM

## 2017-11-18 DIAGNOSIS — F419 Anxiety disorder, unspecified: Secondary | ICD-10-CM

## 2017-11-18 MED ORDER — HYDROXYZINE HCL 10 MG PO TABS
10.0000 mg | ORAL_TABLET | Freq: Three times a day (TID) | ORAL | 0 refills | Status: DC | PRN
Start: 2017-11-18 — End: 2017-12-09

## 2017-11-18 NOTE — Progress Notes (Signed)
Subjective:    Patient ID: Erik DickerMichael A Aguilar, male    DOB: 03/23/1976, 42 y.o.   MRN: [id number]  HPI  Pt presents to the clinic today to follow up HTN. He is currently not taking any antihypertensive medications. His BP today is 146/100.  He feels like his hypertension is high because of his anxiety.  He reports his anxiety has been very bad lately.  He is changing jobs, and he needs a letter that states he can perform his job duties despite his blood pressure.  He is not taking anything daily for anxiety.  He does have periods of panic attacks.  He is requesting Clonazepam which he has taken in the past.  He does have a history of narcotic addiction, recently.  He denies SI/HI  Review of Systems      Past Medical History:  Diagnosis Date  . Allergy   . Anxiety attack   . Chicken pox   . Migraines   . Narcotic addiction [facility](HCC)     Current Outpatient Medications  Medication Sig Dispense Refill  . ALPRAZolam (XANAX) 0.5 MG tablet Take 1 tablet (0.5 mg total) by mouth 2 (two) times daily as needed for anxiety. 30 tablet 0  . aspirin-acetaminophen-caffeine (EXCEDRIN MIGRAINE) 250-250-65 MG tablet Take 2 tablets by mouth every 6 (six) hours as needed for headache or migraine.    . docusate sodium (COLACE) 100 MG capsule Take 1 capsule (100 mg total) by mouth 2 (two) times daily. 10 capsule 0  . fluticasone (FLONASE) 50 MCG/ACT nasal spray Place 2 sprays into both nostrils as needed for allergies or rhinitis.    . methadone (DOLOPHINE) 10 MG tablet Take 1 tablet (10 mg total) by mouth every 12 (twelve) hours. 60 tablet 0  . methocarbamol (ROBAXIN) 500 MG tablet Take 1 tablet (500 mg total) by mouth 3 (three) times daily. 60 tablet 0  . ondansetron (ZOFRAN) 4 MG tablet Take 1 tablet (4 mg total) by mouth every 8 (eight) hours as needed for nausea or vomiting. 45 tablet 1  . oxyCODONE (ROXICODONE) 15 MG immediate release tablet Take 1 tablet (15 mg total) by mouth every 4 (four) hours as needed  for moderate pain or severe pain (10 mg for moderate pain, 15 mg for severe pain). 30 tablet 0  . Oxycodone HCl 10 MG TABS Take 1 tablet (10 mg total) by mouth every 6 (six) hours as needed (pain). 45 tablet 0  . pantoprazole (PROTONIX) 40 MG tablet Take 1 tablet (40 mg total) by mouth daily. 30 tablet 0  . traMADol (ULTRAM) 50 MG tablet Take 1 tablet (50 mg total) by mouth every 6 (six) hours. 60 tablet 0   No current facility-administered medications for this visit.     Allergies  Allergen Reactions  . Tramadol Other (See Comments)    Altered mental status    Family History  Problem Relation Age of Onset  . Mental illness Mother   . Prostate cancer Father   . Prostate cancer Paternal Grandfather     Social History   Socioeconomic History  . Marital status: Married    Spouse name: Not on file  . Number of children: Not on file  . Years of education: Not on file  . Highest education level: Not on file  Occupational History  . Not on file  Social Needs  . Financial resource strain: Not on file  . Food insecurity:    Worry: Not on file  Inability: Not on file  . Transportation needs:    Medical: Not on file    Non-medical: Not on file  Tobacco Use  . Smoking status: Former Smoker    Types: Cigarettes  . Smokeless tobacco: Never Used  . Tobacco comment: pt uses e-cig  Substance and Sexual Activity  . Alcohol use: No    Alcohol/week: 0.0 oz    Frequency: Never    Comment: rare  . Drug use: No  . Sexual activity: Yes  Lifestyle  . Physical activity:    Days per week: Not on file    Minutes per session: Not on file  . Stress: Not on file  Relationships  . Social connections:    Talks on phone: Not on file    Gets together: Not on file    Attends religious service: Not on file    Active member of club or organization: Not on file    Attends meetings of clubs or organizations: Not on file    Relationship status: Not on file  . Intimate partner violence:     Fear of current or ex partner: Not on file    Emotionally abused: Not on file    Physically abused: Not on file    Forced sexual activity: Not on file  Other Topics Concern  . Not on file  Social History Narrative  . Not on file     Constitutional: Denies fever, malaise, fatigue, headache or abrupt weight changes.  Respiratory: Denies difficulty breathing, shortness of breath, cough or sputum production.   Cardiovascular: Denies chest pain, chest tightness, palpitations or swelling in the hands or feet.  Neurological: Denies dizziness, difficulty with memory, difficulty with speech or problems with balance and coordination.  Psych: Pt reports anxiety. Denies depression, SI/HI.  No other specific complaints in a complete review of systems (except as listed in HPI above).  Objective:   Physical Exam   BP (!) 132/94   Pulse 99   Temp (!) 97.4 F (36.3 C) (Oral)   Ht 5\' 7"  (1.702 m)   Wt 272 lb (123.4 kg)   SpO2 96%   BMI 42.60 kg/m  Wt Readings from Last 3 Encounters:  11/18/17 272 lb (123.4 kg)  07/30/17 265 lb 14 oz (120.6 kg)  07/26/17 270 lb 1 oz (122.5 kg)    General: Appears his stated age, obese in NAD. Cardiovascular: Normal rate and rhythm. S1,S2 noted.  No murmur, rubs or gallops noted.  Pulmonary/Chest: Normal effort and positive vesicular breath sounds. No respiratory distress. No wheezes, rales or ronchi noted.  Neurological: Alert and oriented.  Psychiatric: Mood and affect normal. Behavior is normal. Judgment and thought content normal.    BMET    Component Value Date/Time   NA 133 (L) 08/02/2017 0749   NA 136 03/14/2012 1725   K 4.0 08/02/2017 0749   K 4.0 03/14/2012 1725   CL 98 (L) 08/02/2017 0749   CL 100 03/14/2012 1725   CO2 24 08/02/2017 0749   CO2 33 (H) 03/14/2012 1725   GLUCOSE 130 (H) 08/02/2017 0749   GLUCOSE 101 (H) 03/14/2012 1725   BUN 9 08/02/2017 0749   BUN 15 03/14/2012 1725   CREATININE 0.80 08/02/2017 0749   CREATININE 0.88  03/14/2012 1725   CALCIUM 8.8 (L) 08/02/2017 0749   CALCIUM 8.7 03/14/2012 1725   GFRNONAA >60 08/02/2017 0749   GFRNONAA >60 03/14/2012 1725   GFRAA >60 08/02/2017 0749   GFRAA >60 03/14/2012 1725  Lipid Panel  No results found for: CHOL, TRIG, HDL, CHOLHDL, VLDL, LDLCALC  CBC    Component Value Date/Time   WBC 12.1 (H) 08/02/2017 0749   RBC 3.55 (L) 08/02/2017 0749   HGB 9.8 (L) 08/02/2017 0749   HGB 12.8 (L) 03/14/2012 1725   HCT 30.5 (L) 08/02/2017 0749   HCT 37.2 (L) 03/14/2012 1725   PLT 488 (H) 08/02/2017 0749   PLT 328 03/14/2012 1725   MCV 85.9 08/02/2017 0749   MCV 84 03/14/2012 1725   MCH 27.6 08/02/2017 0749   MCHC 32.1 08/02/2017 0749   RDW 12.2 08/02/2017 0749   RDW 12.5 03/14/2012 1725   LYMPHSABS 2.0 08/02/2017 0749   MONOABS 0.7 08/02/2017 0749   EOSABS 0.1 08/02/2017 0749   BASOSABS 0.1 08/02/2017 0749    Hgb A1C No results found for: HGBA1C         Assessment & Plan:   HTN:  Recheck: 132/94 He is refusing treatment for HTN at this time Discussed DASH diet and exercise for weight loss  Anxiety:  He does not want to take SSRI's Advised him I would not fill Clonazepam given his history of addition eRx for Hydroxyzine 10 mg TID prn Referral to psychiatry placed  Return precautions discussed Nicki Reaper, NP

## 2017-11-21 ENCOUNTER — Encounter: Payer: Self-pay | Admitting: Internal Medicine

## 2017-11-21 NOTE — Patient Instructions (Signed)

## 2017-12-09 ENCOUNTER — Other Ambulatory Visit: Payer: Self-pay | Admitting: Internal Medicine

## 2017-12-12 ENCOUNTER — Emergency Department (HOSPITAL_COMMUNITY): Payer: BLUE CROSS/BLUE SHIELD

## 2017-12-12 ENCOUNTER — Encounter (HOSPITAL_COMMUNITY): Payer: Self-pay | Admitting: Emergency Medicine

## 2017-12-12 ENCOUNTER — Other Ambulatory Visit: Payer: Self-pay

## 2017-12-12 ENCOUNTER — Emergency Department (HOSPITAL_COMMUNITY)
Admission: EM | Admit: 2017-12-12 | Discharge: 2017-12-12 | Disposition: A | Discharge status: Home / Self Care | Payer: BLUE CROSS/BLUE SHIELD | Attending: Emergency Medicine | Admitting: Emergency Medicine

## 2017-12-12 DIAGNOSIS — R0602 Shortness of breath: Secondary | ICD-10-CM | POA: Insufficient documentation

## 2017-12-12 DIAGNOSIS — R079 Chest pain, unspecified: Secondary | ICD-10-CM | POA: Diagnosis not present

## 2017-12-12 DIAGNOSIS — I10 Essential (primary) hypertension: Secondary | ICD-10-CM | POA: Diagnosis not present

## 2017-12-12 DIAGNOSIS — Z87891 Personal history of nicotine dependence: Secondary | ICD-10-CM | POA: Insufficient documentation

## 2017-12-12 DIAGNOSIS — F419 Anxiety disorder, unspecified: Secondary | ICD-10-CM | POA: Insufficient documentation

## 2017-12-12 LAB — BASIC METABOLIC PANEL
Anion gap: 11 (ref 5–15)
BUN: 12 mg/dL (ref 6–20)
CHLORIDE: 104 mmol/L (ref 101–111)
CO2: 25 mmol/L (ref 22–32)
Calcium: 8.9 mg/dL (ref 8.9–10.3)
Creatinine, Ser: 0.85 mg/dL (ref 0.61–1.24)
GFR calc Af Amer: >60 mL/min (ref >60)
GFR calc non Af Amer: >60 mL/min (ref >60)
Glucose, Bld: 140 mg/dL — ABNORMAL HIGH (ref 65–99)
POTASSIUM: 4.2 mmol/L (ref 3.5–5.1)
Sodium: 140 mmol/L (ref 135–145)

## 2017-12-12 LAB — I-STAT TROPONIN, ED: Troponin i, poc: 0 ng/mL (ref 0.00–0.08)

## 2017-12-12 LAB — CBC
HEMATOCRIT: 40.3 % (ref 39.0–52.0)
Hemoglobin: 13.1 g/dL (ref 13.0–17.0)
MCH: 27 pg (ref 26.0–34.0)
MCHC: 32.5 g/dL (ref 30.0–36.0)
MCV: 83.1 fL (ref 78.0–100.0)
Platelets: 339 10*3/uL (ref 150–400)
RBC: 4.85 MIL/uL (ref 4.22–5.81)
RDW: 13 % (ref 11.5–15.5)
WBC: 9.1 10*3/uL (ref 4.0–10.5)

## 2017-12-12 MED ORDER — IBUPROFEN 400 MG PO TABS
600.0000 mg | ORAL_TABLET | Freq: Once | ORAL | Status: AC
  Administered 2017-12-12: 600 mg via ORAL
  Filled 2017-12-12: qty 1

## 2017-12-12 NOTE — ED Provider Notes (Signed)
[facility] EMERGENCY DEPARTMENT Provider Note   CSN: [id number] Arrival date & time: 12/12/17  0530     History   Chief Complaint Chief Complaint  Patient presents with  . Chest Pain  . Anxiety    HPI Erik Aguilar is a 42 y.o. male.  HPI Patient presents with chest tightness and shortness of breath associated with anxiety.  States is been dealing with anxiety for the past year.  Currently states the chest tightness has improved.  States he has not seen a psychiatrist or his primary physician regarding this issue.  Denies caffeine or stimulant intake.  Denies recent extended travel or immobilization.  No lower extremity swelling or pain.  No cough, fever or chills.  No family history of coronary artery disease or thrombi embolic disease. Patient also states he has developed gradual onset of throbbing bilateral headache.  No photophobia.  No weakness or numbness. Past Medical History:  Diagnosis Date  . Allergy   . Anxiety attack   . Chicken pox   . Migraines   . Narcotic addiction [facility])     Patient Active Problem List   Diagnosis Date Noted  . Narcotic addiction (HCC) 07/30/2017  . HTN (hypertension) 07/30/2017  . Generalized anxiety disorder   . 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 Environmental allergies 10/15/2015  . Chronic back pain 10/15/2015    Past Surgical History:  Procedure Laterality Date  . NASAL SEPTUM SURGERY    . ORIF TIBIA PLATEAU Left 07/26/2017   Procedure: OPEN REDUCTION INTERNAL FIXATION (ORIF) TIBIAL PLATEAU;  Surgeon: Roby Lofts, MD;  Location: [facility] OR;  Service: Orthopedics;  Laterality: Left;  available to move up  . VASECTOMY  2012        Home Medications    Prior to Admission medications   Medication Sig Start Date End Date Taking? Authorizing Provider  hydrOXYzine (ATARAX/VISTARIL) 10 MG tablet Take 1 tablet (10 mg total) by mouth 3 (three) times daily as needed. 11/18/17   Lorre Munroe, NP    Family  History Family History  Problem Relation Age of Onset  . Mental illness Mother   . Prostate cancer Father   . Prostate cancer Paternal Grandfather     Social History Social History   Tobacco Use  . Smoking status: Former Smoker    Types: Cigarettes  . Smokeless tobacco: Never Used  . Tobacco comment: pt uses e-cig  Substance Use Topics  . Alcohol use: No    Alcohol/week: 0.0 oz    Frequency: Never    Comment: rare  . Drug use: No     Allergies   Tramadol   Review of Systems Review of Systems  Constitutional: Negative for chills and fever.  Respiratory: Positive for chest tightness and shortness of breath. Negative for cough and wheezing.   Cardiovascular: Negative for chest pain, palpitations and leg swelling.  Gastrointestinal: Negative for abdominal pain, constipation, diarrhea, nausea and vomiting.  Genitourinary: Negative for dysuria, flank pain and frequency.  Musculoskeletal: Negative for back pain, myalgias and neck pain.  Skin: Negative for rash and wound.  Neurological: Positive for headaches. Negative for syncope, weakness, light-headedness and numbness.  Psychiatric/Behavioral: The patient is nervous/anxious.   All other systems reviewed and are negative.    Physical Exam Updated Vital Signs BP 137/73   Pulse 81   Temp 98.4 F (36.9 C) (Oral)   Resp 17   Ht  (1.702 m)   Wt 113.4 kg (250 lb)  SpO2 97%   BMI 39.16 kg/m   Physical Exam  Constitutional: He is oriented to person, place, and time. He appears well-developed and well-nourished. No distress.  HENT:  Head: Normocephalic and atraumatic.  Mouth/Throat: Oropharynx is clear and moist. No oropharyngeal exudate.  Eyes: Pupils are equal, round, and reactive to light. EOM are normal.  Neck: Normal range of motion. Neck supple.  Cardiovascular: Normal rate and regular rhythm. Exam reveals no gallop and no friction rub.  No murmur heard. Pulmonary/Chest: Effort normal and breath sounds  normal. No stridor. No respiratory distress. He has no wheezes. He has no rales. He exhibits no tenderness.  Abdominal: Soft. Bowel sounds are normal. There is no tenderness. There is no rebound and no guarding.  Musculoskeletal: Normal range of motion. He exhibits no edema or tenderness.  No lower extremity swelling, asymmetry or tenderness.  Lymphadenopathy:    He has no cervical adenopathy.  Neurological: He is alert and oriented to person, place, and time.  Moves all extremities without focal deficit.  Sensation fully intact.  Skin: Skin is warm and dry. Capillary refill takes less than 2 seconds. No rash noted. He is not diaphoretic. No erythema.  Psychiatric: His behavior is normal.  Mildly anxious appearing  Nursing note and vitals reviewed.    ED Treatments / Results  Labs (all labs ordered are listed, but only abnormal results are displayed) Labs Reviewed  BASIC METABOLIC PANEL - Abnormal; Notable for the following components:      Result Value   Glucose, Bld 140 (*)    All other components within normal limits  CBC  I-STAT TROPONIN, ED    EKG EKG Interpretation  Date/Time:  Sunday Dec 12 2017 05:38:16 EDT Ventricular Rate:  87 PR Interval:  134 QRS Duration: 94 QT Interval:  362 QTC Calculation: 435 R Axis:   56 Text Interpretation:  Normal sinus rhythm Nonspecific ST abnormality Abnormal QRS-T angle, consider primary T wave abnormality Abnormal ECG Confirmed by Loren Racer ([id number]) on 12/12/2017 8:11:42 AM Also confirmed by Loren Racer ([id number]), editor Barbette Hair [id number])  on 12/12/2017 8:50:15 AM   Radiology Dg Chest 2 View  Result Date: 12/12/2017 CLINICAL DATA:  Acute onset of generalized chest tightness. EXAM: CHEST - 2 VIEW COMPARISON:  Chest radiograph performed 07/23/2017 FINDINGS: The lungs are well-aerated. There is mild elevation of the right hemidiaphragm. There is suggestion of a 1.1 cm nodule at the left midlung zone. There is no evidence of  pleural effusion or pneumothorax. The heart is normal in size; the mediastinal contour is within normal limits. No acute osseous abnormalities are seen. IMPRESSION: 1. Mild elevation of the right hemidiaphragm. 2. Suggestion of a 1.1 cm nodule at the left midlung zone. CT of the chest would be helpful for further evaluation, on an elective nonemergent basis. Electronically Signed   By: Roanna Raider M.D.   On: 12/12/2017 06:11    Procedures Procedures (including critical care time)  Medications Ordered in ED Medications  ibuprofen (ADVIL,MOTRIN) tablet 600 mg (600 mg Oral Given 12/12/17 0841)     Initial Impression / Assessment and Plan / ED Course  I have reviewed the triage vital signs and the nursing notes.  Pertinent labs & imaging results that were available during my care of the patient were reviewed by me and considered in my medical decision making (see chart for details).    Review of past records reveal she was seen in the [facility] emergency department for similar symptoms.  At that time he had a psychiatric consult.  Was given prescription for clonazepam at that time.  Patient followed up with lower primary care on 4/25 and was requesting clonazepam at that time.  Was referred to psychiatry and only given a prescription for hydroxyzine.  Patient refused treatment for his hypertension or starting SSRI for his anxiety.  I have some concern for drug-seeking behavior.  Medically, patient's EKG is unchanged without ischemic findings.  Single troponin sufficient to rule out myocardial injury.  Low suspicion for PE.  Reinforced with the patient continues to follow-up with psychiatry.  Return precautions given.   Final Clinical Impressions(s) / ED Diagnoses   Final diagnoses:  Anxiety    ED Discharge Orders    None       Loren Racer, MD 12/12/17 [phone]

## 2017-12-12 NOTE — ED Triage Notes (Signed)
Pt reports more "chest tightness than normal" X4 days.  Pt denies emesis, diarrhea, weakness or dizziness.  States this does happens w/ his anxiety however never this long or "this much tightness."

## 2017-12-31 ENCOUNTER — Ambulatory Visit (HOSPITAL_COMMUNITY)
Admission: EM | Admit: 2017-12-31 | Discharge: 2017-12-31 | Discharge status: Against Medical Advice | Payer: BLUE CROSS/BLUE SHIELD | Attending: Family Medicine | Admitting: Family Medicine

## 2017-12-31 NOTE — ED Notes (Signed)
Per pt access pt left 

## 2018-01-02 ENCOUNTER — Other Ambulatory Visit: Payer: Self-pay | Admitting: Internal Medicine

## 2018-01-03 NOTE — Telephone Encounter (Signed)
Electronic refill request Last refill 12/17/17 #60 Last office visit 11/21/17

## 2018-02-07 DIAGNOSIS — H5203 Hypermetropia, bilateral: Secondary | ICD-10-CM | POA: Diagnosis not present

## 2018-02-21 ENCOUNTER — Ambulatory Visit (INDEPENDENT_AMBULATORY_CARE_PROVIDER_SITE_OTHER): Payer: 59 | Admitting: Internal Medicine

## 2018-02-21 ENCOUNTER — Encounter: Payer: Self-pay | Admitting: Internal Medicine

## 2018-02-21 VITALS — BP 124/82 | HR 81 | Temp 98.0°F | Wt 274.0 lb

## 2018-02-21 DIAGNOSIS — R0683 Snoring: Secondary | ICD-10-CM

## 2018-02-21 DIAGNOSIS — R51 Headache: Secondary | ICD-10-CM

## 2018-02-21 DIAGNOSIS — R519 Headache, unspecified: Secondary | ICD-10-CM

## 2018-02-21 DIAGNOSIS — G479 Sleep disorder, unspecified: Secondary | ICD-10-CM | POA: Diagnosis not present

## 2018-02-21 NOTE — Patient Instructions (Signed)

## 2018-02-21 NOTE — Progress Notes (Signed)
Subjective:    Patient ID: Erik Aguilar, male    DOB: July 05, 1976, 42 y.o.   MRN: [id number]  HPI  Pt presents to the clinic today with concern for sleep apnea. He reports he is able to fall asleep but can't stay asleep. He feels tired in the morning, and wakes up with headaches every day. The pain is located in the temples. He describes the pain as pressure. He denies visual changes and dizziness. He thought the headaches were blood pressure related, but no improvement with headaches once he started taking Lisinopril. He reports he has been told that he snores when he sleeps and often stops breathing for periods of time. He has recently gained a lot of weight and feels like this is a contributing factor. He would like a referral for a sleep study today.  Review of Systems  Past Medical History:  Diagnosis Date  . Allergy   . Anxiety attack   . Chicken pox   . Migraines   . Narcotic addiction [facility])     Current Outpatient Medications  Medication Sig Dispense Refill  . aspirin-acetaminophen-caffeine (EXCEDRIN MIGRAINE) 250-250-65 MG tablet Take by mouth every 6 (six) hours as needed for headache.    . lisinopril (PRINIVIL,ZESTRIL) 10 MG tablet Take 1 tablet by mouth daily.  5   No current facility-administered medications for this visit.     Allergies  Allergen Reactions  . Tramadol Other (See Comments)    Altered mental status    Family History  Problem Relation Age of Onset  . Mental illness Mother   . Prostate cancer Father   . Prostate cancer Paternal Grandfather     Social History   Socioeconomic History  . Marital status: Married    Spouse name: Not on file  . Number of children: Not on file  . Years of education: Not on file  . Highest education level: Not on file  Occupational History  . Not on file  Social Needs  . Financial resource strain: Not on file  . Food insecurity:    Worry: Not on file    Inability: Not on file  . Transportation needs:   Medical: Not on file    Non-medical: Not on file  Tobacco Use  . Smoking status: Former Smoker    Types: Cigarettes  . Smokeless tobacco: Never Used  . Tobacco comment: pt uses e-cig  Substance and Sexual Activity  . Alcohol use: No    Alcohol/week: 0.0 oz    Frequency: Never    Comment: rare  . Drug use: No  . Sexual activity: Yes  Lifestyle  . Physical activity:    Days per week: Not on file    Minutes per session: Not on file  . Stress: Not on file  Relationships  . Social connections:    Talks on phone: Not on file    Gets together: Not on file    Attends religious service: Not on file    Active member of club or organization: Not on file    Attends meetings of clubs or organizations: Not on file    Relationship status: Not on file  . Intimate partner violence:    Fear of current or ex partner: Not on file    Emotionally abused: Not on file    Physically abused: Not on file    Forced sexual activity: Not on file  Other Topics Concern  . Not on file  Social History Narrative  . Not  on file     Constitutional: Pt reports daily headaches. Denies fever, malaise, fatigue, or abrupt weight changes.  HEENT: Denies eye pain, eye redness, ear pain, ringing in the ears, wax buildup, runny nose, nasal congestion, bloody nose, or sore throat. Respiratory: Denies difficulty breathing, shortness of breath, cough or sputum production.   Cardiovascular: Denies chest pain, chest tightness, palpitations or swelling in the hands or feet.  Neurological: Pt reports insomnia. Denies dizziness, difficulty with memory, difficulty with speech or problems with balance and coordination.    No other specific complaints in a complete review of systems (except as listed in HPI above).     Objective:   Physical Exam   BP 124/82   Pulse 81   Temp 98 F (36.7 C) (Oral)   Wt 274 lb (124.3 kg)   SpO2 96%   BMI 42.91 kg/m  Wt Readings from Last 3 Encounters:  02/21/18 274 lb (124.3 kg)    12/12/17 250 lb (113.4 kg)  11/18/17 272 lb (123.4 kg)    General: Appears his stated age, obese in NAD. HEENT:Throat/Mouth: Teeth present, mucosa pink and moist, no exudate, lesions or ulcerations noted.  Neck:  Neck supple, trachea midline. No masses, lumps or thyromegaly present.  Cardiovascular: Normal rate and rhythm. S1,S2 noted.  No murmur, rubs or gallops noted. Pulmonary/Chest: Normal effort and positive vesicular breath sounds. No respiratory distress. No wheezes, rales or ronchi noted.  Neurological: Alert and oriented.    BMET    Component Value Date/Time   NA 140 12/12/2017 0554   NA 136 03/14/2012 1725   K 4.2 12/12/2017 0554   K 4.0 03/14/2012 1725   CL 104 12/12/2017 0554   CL 100 03/14/2012 1725   CO2 25 12/12/2017 0554   CO2 33 (H) 03/14/2012 1725   GLUCOSE 140 (H) 12/12/2017 0554   GLUCOSE 101 (H) 03/14/2012 1725   BUN 12 12/12/2017 0554   BUN 15 03/14/2012 1725   CREATININE 0.85 12/12/2017 0554   CREATININE 0.88 03/14/2012 1725   CALCIUM 8.9 12/12/2017 0554   CALCIUM 8.7 03/14/2012 1725   GFRNONAA >60 12/12/2017 0554   GFRNONAA >60 03/14/2012 1725   GFRAA >60 12/12/2017 0554   GFRAA >60 03/14/2012 1725    Lipid Panel  No results found for: CHOL, TRIG, HDL, CHOLHDL, VLDL, LDLCALC  CBC    Component Value Date/Time   WBC 9.1 12/12/2017 0554   RBC 4.85 12/12/2017 0554   HGB 13.1 12/12/2017 0554   HGB 12.8 (L) 03/14/2012 1725   HCT 40.3 12/12/2017 0554   HCT 37.2 (L) 03/14/2012 1725   PLT 339 12/12/2017 0554   PLT 328 03/14/2012 1725   MCV 83.1 12/12/2017 0554   MCV 84 03/14/2012 1725   MCH 27.0 12/12/2017 0554   MCHC 32.5 12/12/2017 0554   RDW 13.0 12/12/2017 0554   RDW 12.5 03/14/2012 1725   LYMPHSABS 2.0 08/02/2017 0749   MONOABS 0.7 08/02/2017 0749   EOSABS 0.1 08/02/2017 0749   BASOSABS 0.1 08/02/2017 0749    Hgb A1C No results found for: HGBA1C         Assessment & Plan:   Snoring, Difficulty Sleeping, Daily  Headaches:  Epworth Sleepiness Scale: score of 13 Neck Circumference: 18.25 inches Encouraged sleeping elevated on 2-3 pillows Encouraged weight loss as this may be a contributing factor Referral placed to pulmonology for possible sleep study.  Return precautions discussed Nicki Reaperegina Tedrick Port, NP

## 2018-03-21 ENCOUNTER — Institutional Professional Consult (permissible substitution): Payer: 59 | Admitting: Pulmonary Disease

## 2018-03-31 ENCOUNTER — Other Ambulatory Visit: Payer: Self-pay | Admitting: Internal Medicine

## 2018-04-01 ENCOUNTER — Institutional Professional Consult (permissible substitution): Payer: 59 | Admitting: Pulmonary Disease

## 2018-04-01 NOTE — Telephone Encounter (Signed)
Is pt supposed to continue medication? Please advise

## 2018-04-03 NOTE — Telephone Encounter (Signed)
Yes, should  continue  

## 2018-04-19 ENCOUNTER — Other Ambulatory Visit: Payer: Self-pay | Admitting: Internal Medicine

## 2018-04-21 NOTE — Telephone Encounter (Signed)
Last filled 04/03/18 with no refills, is pt supposed to continue meds?

## 2018-04-22 NOTE — Telephone Encounter (Signed)
Can you call and see if he has seen psychiatry lately?

## 2018-04-28 DIAGNOSIS — F419 Anxiety disorder, unspecified: Secondary | ICD-10-CM | POA: Diagnosis not present

## 2018-04-28 DIAGNOSIS — R7309 Other abnormal glucose: Secondary | ICD-10-CM | POA: Diagnosis not present

## 2018-04-28 DIAGNOSIS — E7800 Pure hypercholesterolemia, unspecified: Secondary | ICD-10-CM | POA: Diagnosis not present

## 2018-04-28 DIAGNOSIS — I10 Essential (primary) hypertension: Secondary | ICD-10-CM | POA: Diagnosis not present

## 2018-04-28 DIAGNOSIS — G44209 Tension-type headache, unspecified, not intractable: Secondary | ICD-10-CM | POA: Diagnosis not present

## 2018-04-28 DIAGNOSIS — F339 Major depressive disorder, recurrent, unspecified: Secondary | ICD-10-CM | POA: Diagnosis not present

## 2018-04-28 DIAGNOSIS — E559 Vitamin D deficiency, unspecified: Secondary | ICD-10-CM | POA: Diagnosis not present

## 2018-05-02 NOTE — Telephone Encounter (Signed)
Left detailed msg on VM per HIPAA  

## 2018-05-12 DIAGNOSIS — R7303 Prediabetes: Secondary | ICD-10-CM | POA: Diagnosis not present

## 2018-05-12 DIAGNOSIS — F419 Anxiety disorder, unspecified: Secondary | ICD-10-CM | POA: Diagnosis not present

## 2018-05-12 DIAGNOSIS — F339 Major depressive disorder, recurrent, unspecified: Secondary | ICD-10-CM | POA: Diagnosis not present

## 2018-05-12 DIAGNOSIS — E559 Vitamin D deficiency, unspecified: Secondary | ICD-10-CM | POA: Diagnosis not present

## 2018-05-12 DIAGNOSIS — Z6841 Body Mass Index (BMI) 40.0 and over, adult: Secondary | ICD-10-CM | POA: Diagnosis not present

## 2018-06-17 DIAGNOSIS — F339 Major depressive disorder, recurrent, unspecified: Secondary | ICD-10-CM | POA: Diagnosis not present

## 2018-06-17 DIAGNOSIS — F419 Anxiety disorder, unspecified: Secondary | ICD-10-CM | POA: Diagnosis not present

## 2018-06-17 DIAGNOSIS — I10 Essential (primary) hypertension: Secondary | ICD-10-CM | POA: Diagnosis not present

## 2018-06-17 DIAGNOSIS — Z79899 Other long term (current) drug therapy: Secondary | ICD-10-CM | POA: Diagnosis not present

## 2018-06-17 DIAGNOSIS — Z87820 Personal history of traumatic brain injury: Secondary | ICD-10-CM | POA: Diagnosis not present

## 2018-06-17 MED FILL — LISINOPRIL 10 MG TABS: 10 | 30 days supply | Qty: 30 | Fill #0

## 2018-06-17 MED FILL — PARoxetine HCL 20 MG TABS: 20 | 30 days supply | Qty: 30 | Fill #0

## 2018-06-17 MED FILL — clonazePAM 0.5 MG TABS: 0.5 | 30 days supply | Qty: 60 | Fill #0

## 2018-07-14 DIAGNOSIS — F419 Anxiety disorder, unspecified: Secondary | ICD-10-CM | POA: Diagnosis not present

## 2018-07-14 DIAGNOSIS — E559 Vitamin D deficiency, unspecified: Secondary | ICD-10-CM | POA: Diagnosis not present

## 2018-07-14 DIAGNOSIS — G479 Sleep disorder, unspecified: Secondary | ICD-10-CM | POA: Diagnosis not present

## 2018-07-14 DIAGNOSIS — I10 Essential (primary) hypertension: Secondary | ICD-10-CM | POA: Diagnosis not present

## 2018-07-14 DIAGNOSIS — Z79899 Other long term (current) drug therapy: Secondary | ICD-10-CM | POA: Diagnosis not present

## 2018-07-14 DIAGNOSIS — R400 Somnolence: Secondary | ICD-10-CM | POA: Diagnosis not present

## 2018-07-14 DIAGNOSIS — F339 Major depressive disorder, recurrent, unspecified: Secondary | ICD-10-CM | POA: Diagnosis not present

## 2018-07-14 MED FILL — PROPRANOLOL 60 MG TABLET: 60 | 30 days supply | Qty: 30 | Fill #0

## 2018-07-14 MED FILL — PARoxetine HCL 40 MG TABS: 40 | 30 days supply | Qty: 30 | Fill #0

## 2018-07-15 MED FILL — clonazePAM 0.5 MG TABS: 0.5 | 30 days supply | Qty: 60 | Fill #0

## 2018-08-17 MED FILL — PROPRANOLOL 60 MG TABLET: 60 | 30 days supply | Qty: 30 | Fill #1

## 2018-08-17 MED FILL — PARoxetine HCL 40 MG TABS: 40 | 30 days supply | Qty: 30 | Fill #1

## 2018-08-18 DIAGNOSIS — G43909 Migraine, unspecified, not intractable, without status migrainosus: Secondary | ICD-10-CM | POA: Diagnosis not present

## 2018-08-18 DIAGNOSIS — F419 Anxiety disorder, unspecified: Secondary | ICD-10-CM | POA: Diagnosis not present

## 2018-08-18 DIAGNOSIS — Z79899 Other long term (current) drug therapy: Secondary | ICD-10-CM | POA: Diagnosis not present

## 2018-08-18 DIAGNOSIS — F339 Major depressive disorder, recurrent, unspecified: Secondary | ICD-10-CM | POA: Diagnosis not present

## 2018-08-18 MED FILL — clonazePAM 0.5 MG TABS: 0.5 | 30 days supply | Qty: 60 | Fill #0

## 2018-08-26 ENCOUNTER — Emergency Department (HOSPITAL_COMMUNITY)
Admission: EM | Admit: 2018-08-26 | Discharge: 2018-08-26 | Disposition: A | Discharge status: Home / Self Care | Payer: PRIVATE HEALTH INSURANCE | Attending: Emergency Medicine | Admitting: Emergency Medicine

## 2018-08-26 ENCOUNTER — Emergency Department (HOSPITAL_COMMUNITY): Payer: PRIVATE HEALTH INSURANCE

## 2018-08-26 ENCOUNTER — Encounter (HOSPITAL_COMMUNITY): Payer: Self-pay | Admitting: Emergency Medicine

## 2018-08-26 DIAGNOSIS — Z79899 Other long term (current) drug therapy: Secondary | ICD-10-CM | POA: Insufficient documentation

## 2018-08-26 DIAGNOSIS — Y92310 Basketball court as the place of occurrence of the external cause: Secondary | ICD-10-CM | POA: Insufficient documentation

## 2018-08-26 DIAGNOSIS — Y9367 Activity, basketball: Secondary | ICD-10-CM | POA: Insufficient documentation

## 2018-08-26 DIAGNOSIS — W01198A Fall on same level from slipping, tripping and stumbling with subsequent striking against other object, initial encounter: Secondary | ICD-10-CM | POA: Diagnosis not present

## 2018-08-26 DIAGNOSIS — S40011A Contusion of right shoulder, initial encounter: Secondary | ICD-10-CM | POA: Insufficient documentation

## 2018-08-26 DIAGNOSIS — Y998 Other external cause status: Secondary | ICD-10-CM | POA: Insufficient documentation

## 2018-08-26 DIAGNOSIS — Z87891 Personal history of nicotine dependence: Secondary | ICD-10-CM | POA: Diagnosis not present

## 2018-08-26 DIAGNOSIS — S4991XA Unspecified injury of right shoulder and upper arm, initial encounter: Secondary | ICD-10-CM | POA: Diagnosis present

## 2018-08-26 DIAGNOSIS — S0003XA Contusion of scalp, initial encounter: Secondary | ICD-10-CM | POA: Insufficient documentation

## 2018-08-26 DIAGNOSIS — W19XXXA Unspecified fall, initial encounter: Secondary | ICD-10-CM

## 2018-08-26 MED ORDER — ACETAMINOPHEN 325 MG PO TABS
650.0000 mg | ORAL_TABLET | Freq: Once | ORAL | Status: AC
  Administered 2018-08-26: 650 mg via ORAL
  Filled 2018-08-26: qty 2

## 2018-08-26 NOTE — ED Provider Notes (Signed)
[facility]-EMERGENCY DEPT Provider Note   CSN: [id number] Arrival date & time: 08/26/18  1403     History   Chief Complaint Chief Complaint  Patient presents with  . Fall    HPI Verl DickerMichael A Askari is a 43 y.o. male [facility] s/p fall with c/o right shoulder pain. Patient reports he was playing basketball and tripped and fell on his right side. Patient does report hitting his head but denies LOC. Patient does report feeling dizzy at the time but it passed quickly.  HPI  Past Medical History:  Diagnosis Date  . Allergy   . Anxiety attack   . Chicken pox   . Migraines   . Narcotic addiction [facility](HCC)     Patient Active Problem List   Diagnosis Date Noted  . Narcotic addiction (HCC) 07/30/2017  . HTN (hypertension) 07/30/2017  . Generalized anxiety disorder   . 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 Environmental allergies 10/15/2015  . Chronic back pain 10/15/2015    Past Surgical History:  Procedure Laterality Date  . NASAL SEPTUM SURGERY    . ORIF TIBIA PLATEAU Left 07/26/2017   Procedure: OPEN REDUCTION INTERNAL FIXATION (ORIF) TIBIAL PLATEAU;  Surgeon: Roby LoftsHaddix, Kevin P, MD;  Location: [facility] OR;  Service: Orthopedics;  Laterality: Left;  available to move up  . VASECTOMY  2012        Home Medications    Prior to Admission medications   Medication Sig Start Date End Date Taking? Authorizing Provider  aspirin-acetaminophen-caffeine (EXCEDRIN MIGRAINE) [phone] MG tablet Take by mouth every 6 (six) hours as needed for headache.    [provider]  hydrOXYzine (ATARAX/VISTARIL) 10 MG tablet TAKE 1 TABLET BY MOUTH THREE TIMES A DAY AS NEEDED 04/03/18   Lorre MunroeBaity, Regina W, NP  lisinopril (PRINIVIL,ZESTRIL) 10 MG tablet Take 1 tablet by mouth daily. 02/08/18   [provider]    Family History Family History  Problem Relation Age of Onset  . Mental illness Mother   . Prostate cancer  Father   . Prostate cancer Paternal Grandfather     Social History Social History   Tobacco Use  . Smoking status: Former Smoker    Types: Cigarettes  . Smokeless tobacco: Never Used  . Tobacco comment: pt uses e-cig  Substance Use Topics  . Alcohol use: No    Alcohol/week: 0.0 standard drinks    Frequency: Never    Comment: rare  . Drug use: No     Allergies   Tramadol   Review of Systems Review of Systems  Constitutional: Negative for diaphoresis.  HENT: Negative.   Eyes: Negative for visual disturbance.  Respiratory: Negative for shortness of breath.   Cardiovascular: Negative for chest pain.  Gastrointestinal: Positive for nausea. Negative for abdominal pain and vomiting.  Genitourinary:       No loss of control of bladder or bowels.  Musculoskeletal: Positive for neck pain (right side).  Skin: Negative for wound.  Neurological: Positive for headaches. Negative for syncope.  Psychiatric/Behavioral: Negative for confusion.     Physical Exam Updated Vital Signs BP (!) 153/98 (BP Location: Right Arm)   Pulse 86   Temp 98.1 F (36.7 C) (Oral)   Resp 18   SpO2 97%   Physical Exam Vitals signs and nursing note reviewed.  Constitutional:      General: He is not in acute distress.    Appearance: He  is well-developed.  HENT:     Head: Normocephalic.     Comments: Mild tenderness right side of head just above right ear. No hematoma palpated, no wound visualized.     Right Ear: Tympanic membrane normal.     Left Ear: Tympanic membrane normal.     Nose: Nose normal.     Mouth/Throat:     Mouth: Mucous membranes are moist.  Eyes:     Extraocular Movements: Extraocular movements intact.     Conjunctiva/sclera: Conjunctivae normal.     Pupils: Pupils are equal, round, and reactive to light.  Neck:     Musculoskeletal: Normal range of motion and neck supple. Muscular tenderness (right side) present. No spinous process tenderness.  Cardiovascular:     Rate  and Rhythm: Normal rate.  Pulmonary:     Effort: Pulmonary effort is normal.  Abdominal:     Tenderness: There is no abdominal tenderness.  Musculoskeletal: Normal range of motion.  Skin:    General: Skin is warm and dry.  Neurological:     Mental Status: He is alert and oriented to person, place, and time.     Cranial Nerves: No cranial nerve deficit.     Sensory: Sensation is intact.     Motor: No weakness.     Coordination: Romberg sign negative.     Gait: Gait normal.     Comments: Stands on one foot without difficulty  Psychiatric:        Mood and Affect: Mood normal.      ED Treatments / Results  Labs (all labs ordered are listed, but only abnormal results are displayed) Labs Reviewed - No data to display  Radiology Dg Humerus Right  Result Date: 08/26/2018 CLINICAL DATA:  Per pt, states he was playing basketball and tripped falling on right side-states he hit back and right side of head and right shoulder, states pain to proximal right shoulder/humerus EXAM: RIGHT HUMERUS - 2+ VIEW COMPARISON:  None. FINDINGS: There is no evidence of fracture or other focal bone lesions. Soft tissues are unremarkable. IMPRESSION: Negative. Electronically Signed   By: Amie Portlandavid  Ormond M.D.   On: 08/26/2018 15:04    Procedures Procedures (including critical care time)  Medications Ordered in ED Medications  acetaminophen (TYLENOL) tablet 650 mg (650 mg Oral Given 08/26/18 1658)   I discussed this case with Dr. Patria Maneampos  Initial Impression / Assessment and Plan / ED Course  I have reviewed the triage vital signs and the nursing notes. 43 y.o. male here with right shoulder pain s/p fall and mild right side scalp discomfort. Stable for d/c without fracture or dislocation. Head CT not indicated at this time. Patient to f/u with Employee health or return here for worsening symptoms.   Final Clinical Impressions(s) / ED Diagnoses   Final diagnoses:  Fall, initial encounter  Contusion of  right shoulder, initial encounter  Contusion of scalp, initial encounter    ED Discharge Orders    None       Kerrie Buffaloeese, Mikle Sternberg NenanaM, NP 08/26/18 2118    Wynetta FinesMessick, Peter C, MD 08/28/18 [id number]

## 2018-08-26 NOTE — Discharge Instructions (Addendum)
Take tylenol as needed for pain. Follow up with your doctor. Return here as needed for worsening symptoms.

## 2018-08-26 NOTE — ED Notes (Signed)
Bed: WTR7 Expected date:  Expected time:  Means of arrival:  Comments: [place]

## 2018-08-26 NOTE — ED Triage Notes (Signed)
Per pt, states he was playing basketball and tripped falling on right side-states he hit back and right side of head and right shoulder-no LOC-history of head injury from [facility]

## 2018-09-06 DIAGNOSIS — G44209 Tension-type headache, unspecified, not intractable: Secondary | ICD-10-CM | POA: Diagnosis not present

## 2018-09-06 DIAGNOSIS — F419 Anxiety disorder, unspecified: Secondary | ICD-10-CM | POA: Diagnosis not present

## 2018-09-06 DIAGNOSIS — G43909 Migraine, unspecified, not intractable, without status migrainosus: Secondary | ICD-10-CM | POA: Diagnosis not present

## 2018-09-06 DIAGNOSIS — F339 Major depressive disorder, recurrent, unspecified: Secondary | ICD-10-CM | POA: Diagnosis not present

## 2018-09-13 ENCOUNTER — Ambulatory Visit: Payer: Self-pay

## 2018-09-13 ENCOUNTER — Other Ambulatory Visit: Payer: Self-pay | Admitting: Occupational Medicine

## 2018-09-13 DIAGNOSIS — M542 Cervicalgia: Secondary | ICD-10-CM

## 2018-09-13 MED FILL — CYCLOBENZAPRINE 10 MG TAB: 10 | 30 days supply | Qty: 30 | Fill #0

## 2018-09-15 MED FILL — PROPRANOLOL 60 MG TABLET: 60 | 30 days supply | Qty: 30 | Fill #2 | Status: TO

## 2018-09-15 MED FILL — clonazePAM 0.5 MG TABS: 0.5 | 30 days supply | Qty: 60 | Fill #1

## 2018-09-15 MED FILL — PARoxetine HCL 40 MG TABS: 40 | 30 days supply | Qty: 30 | Fill #2

## 2018-10-18 DIAGNOSIS — F339 Major depressive disorder, recurrent, unspecified: Secondary | ICD-10-CM | POA: Diagnosis not present

## 2018-10-18 DIAGNOSIS — I10 Essential (primary) hypertension: Secondary | ICD-10-CM | POA: Diagnosis not present

## 2018-10-18 DIAGNOSIS — Z79899 Other long term (current) drug therapy: Secondary | ICD-10-CM | POA: Diagnosis not present

## 2018-10-18 DIAGNOSIS — F419 Anxiety disorder, unspecified: Secondary | ICD-10-CM | POA: Diagnosis not present

## 2018-10-18 DIAGNOSIS — R7303 Prediabetes: Secondary | ICD-10-CM | POA: Diagnosis not present

## 2018-10-18 DIAGNOSIS — E559 Vitamin D deficiency, unspecified: Secondary | ICD-10-CM | POA: Diagnosis not present

## 2018-10-18 DIAGNOSIS — R7309 Other abnormal glucose: Secondary | ICD-10-CM | POA: Diagnosis not present

## 2018-10-18 DIAGNOSIS — R5383 Other fatigue: Secondary | ICD-10-CM | POA: Diagnosis not present

## 2018-10-18 DIAGNOSIS — G43909 Migraine, unspecified, not intractable, without status migrainosus: Secondary | ICD-10-CM | POA: Diagnosis not present

## 2018-10-18 DIAGNOSIS — E7800 Pure hypercholesterolemia, unspecified: Secondary | ICD-10-CM | POA: Diagnosis not present

## 2018-10-24 MED FILL — PARoxetine HCL 40 MG TABS: 40 | 30 days supply | Qty: 30 | Fill #0

## 2018-10-24 MED FILL — PROPRANOLOL 60 MG TABLET: 60 | 30 days supply | Qty: 30 | Fill #0

## 2019-02-01 IMAGING — CR DG HUMERUS 2V *R*
3 series · 3 of 3 positions shown · non-contrast
Comparison: None.

CLINICAL DATA: Per pt, states he was playing basketball and tripped
falling on right side-states he hit back and right side of head and
right shoulder, states pain to proximal right shoulder/humerus

EXAM:
RIGHT HUMERUS - 2+ VIEW

[w humerus ap right (1 of 2)]
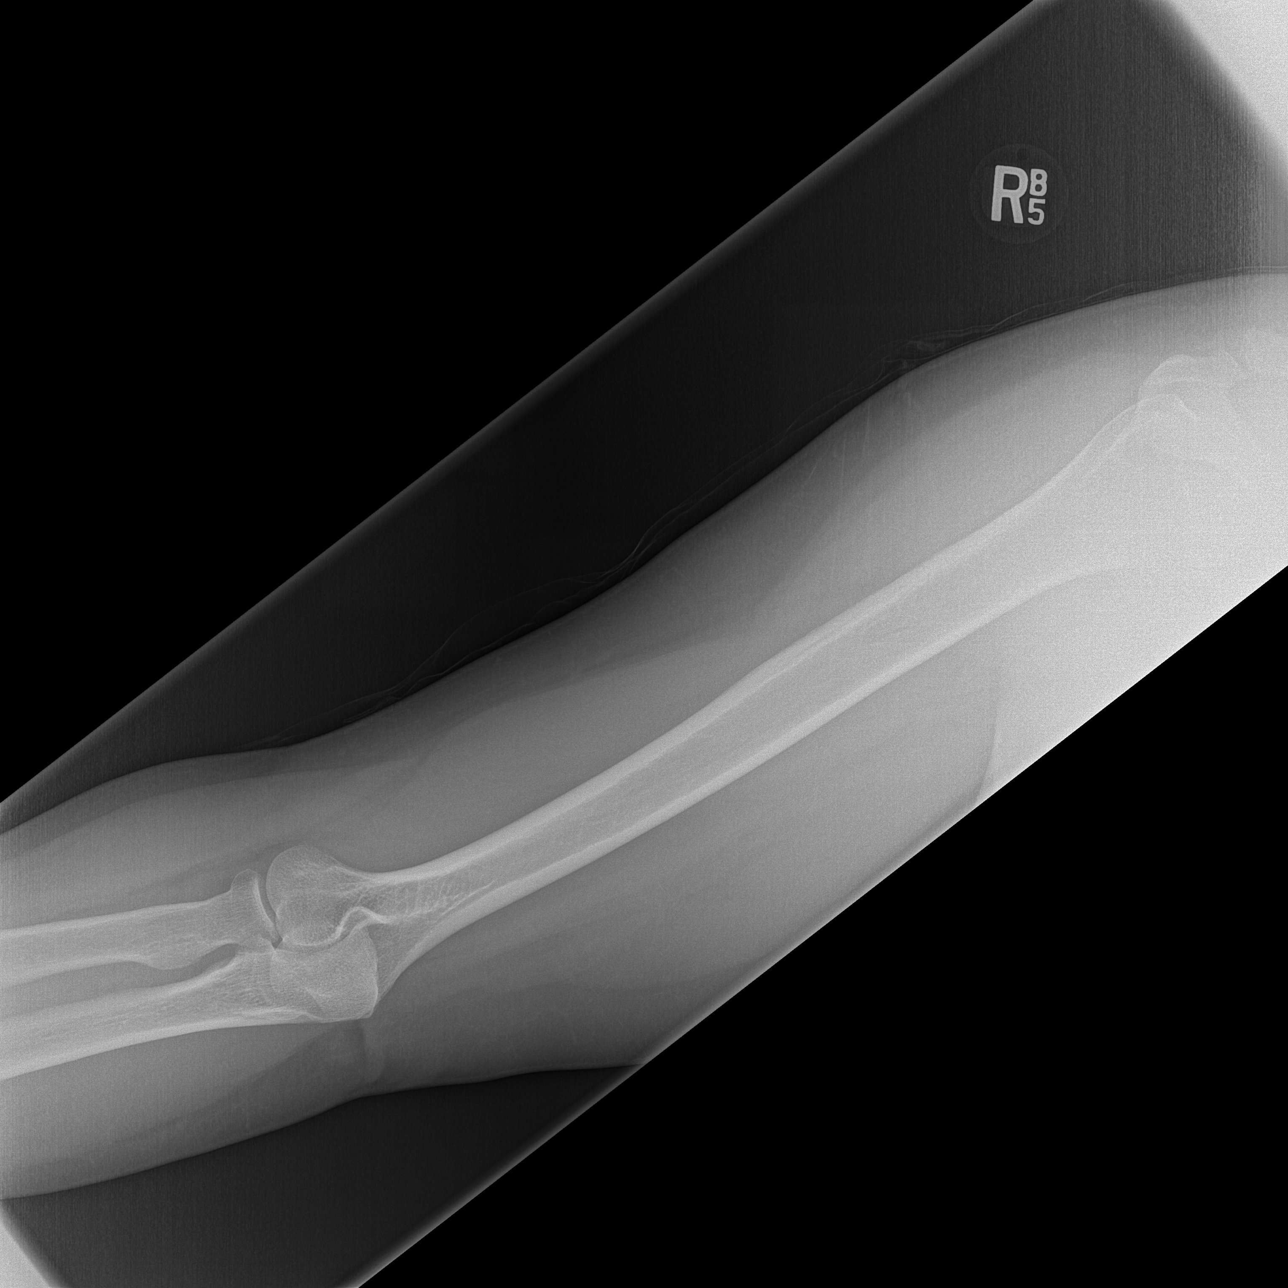

[w humerus ap right (2 of 2)]
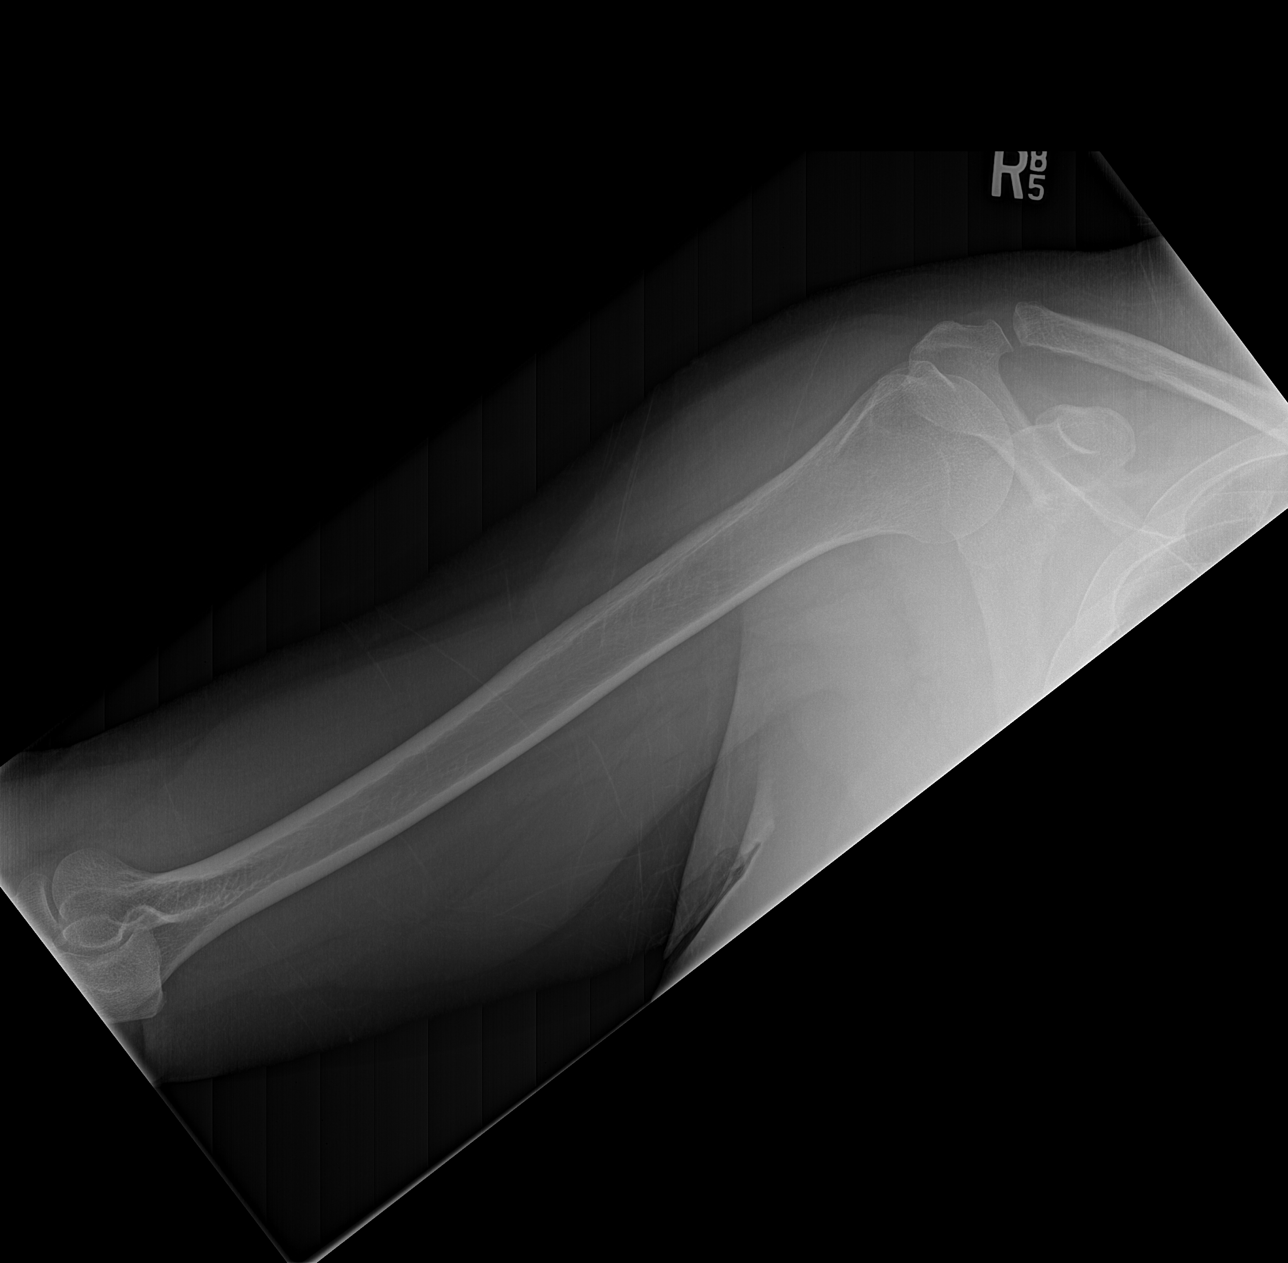

[w humerus lat right]
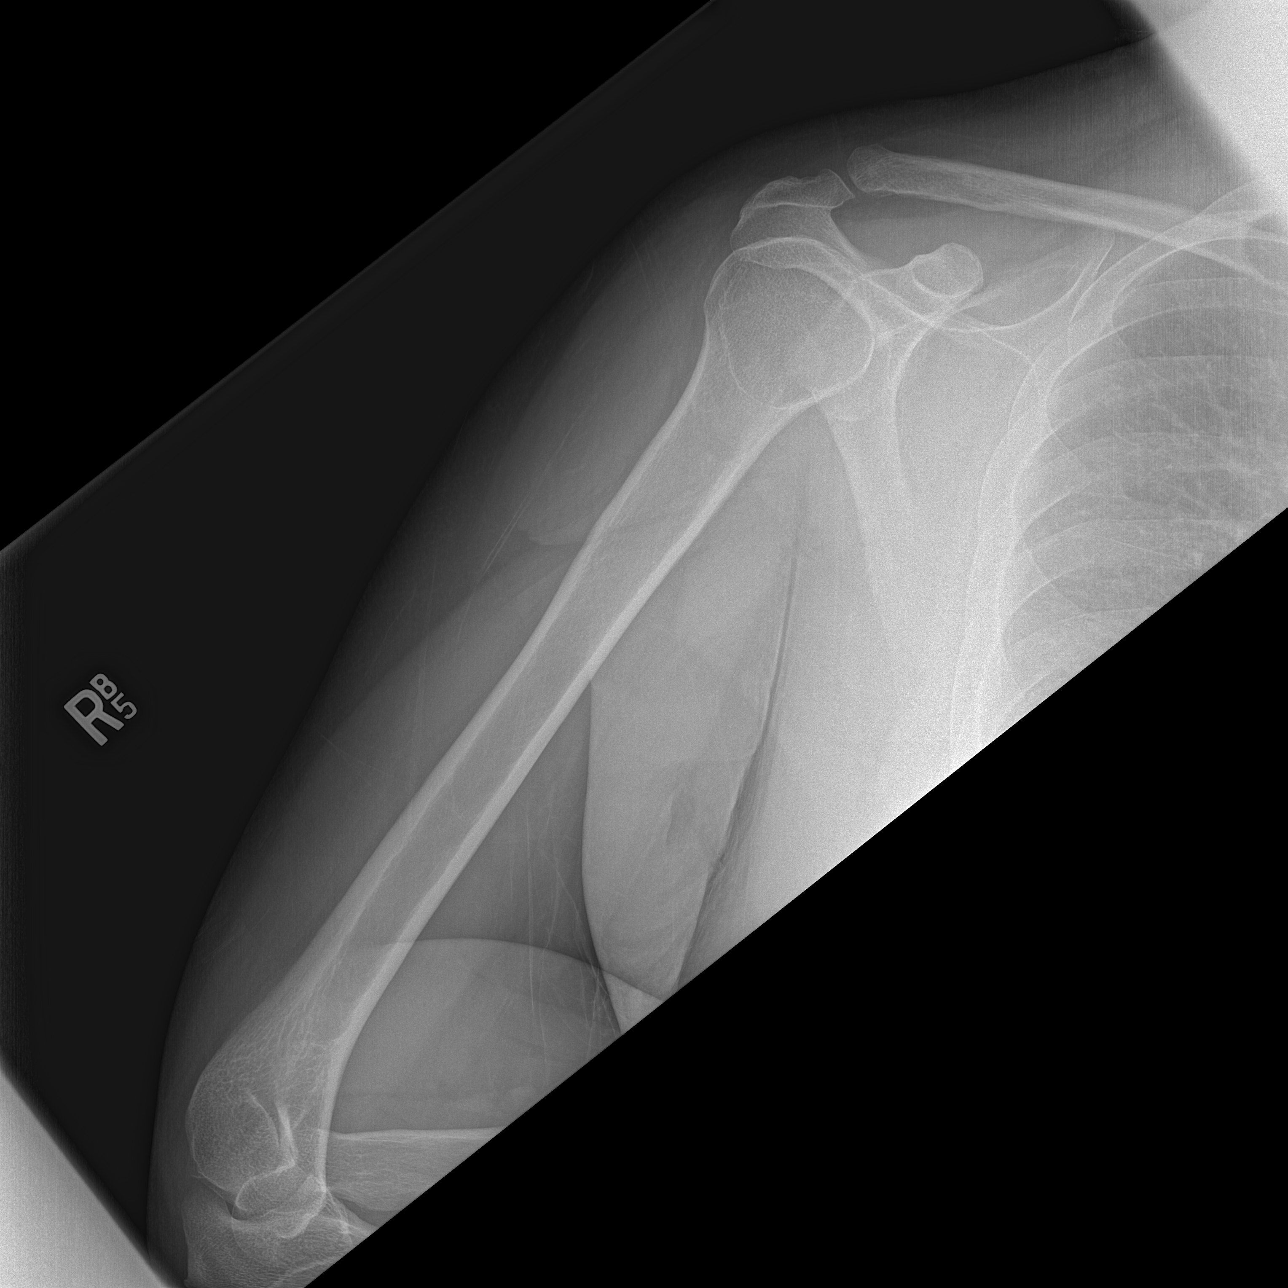

[3 of 3 positions shown; findings below may reference images not displayed]

FINDINGS: There is no evidence of fracture or other focal bone lesions. Soft
tissues are unremarkable.
IMPRESSION: Negative.
